# Patient Record
Sex: Female | Born: 1960 | Race: White | Hispanic: No | Smoking: Never smoker
Health system: Southern US, Community
[De-identification: ages and names within clinical notes are randomized; demographics above are authoritative.]

## PROBLEM LIST (undated history)

## (undated) DIAGNOSIS — E119 Type 2 diabetes mellitus without complications: Secondary | ICD-10-CM

## (undated) DIAGNOSIS — I10 Essential (primary) hypertension: Secondary | ICD-10-CM

## (undated) HISTORY — PX: LAPAROSCOPIC GASTRIC SLEEVE RESECTION: SHX5895

---

## 2017-02-15 ENCOUNTER — Emergency Department (HOSPITAL_COMMUNITY): Payer: PRIVATE HEALTH INSURANCE

## 2017-02-15 ENCOUNTER — Inpatient Hospital Stay (HOSPITAL_COMMUNITY)
Admission: EM | Admit: 2017-02-15 | Discharge: 2017-02-21 | DRG: 638 | Disposition: A | Discharge status: Home / Self Care | Payer: PRIVATE HEALTH INSURANCE | Attending: Family Medicine | Admitting: Family Medicine

## 2017-02-15 ENCOUNTER — Encounter (HOSPITAL_COMMUNITY): Payer: Self-pay | Admitting: Emergency Medicine

## 2017-02-15 DIAGNOSIS — Z888 Allergy status to other drugs, medicaments and biological substances status: Secondary | ICD-10-CM

## 2017-02-15 DIAGNOSIS — Z79899 Other long term (current) drug therapy: Secondary | ICD-10-CM | POA: Diagnosis not present

## 2017-02-15 DIAGNOSIS — R112 Nausea with vomiting, unspecified: Secondary | ICD-10-CM

## 2017-02-15 DIAGNOSIS — I10 Essential (primary) hypertension: Secondary | ICD-10-CM | POA: Diagnosis present

## 2017-02-15 DIAGNOSIS — E1110 Type 2 diabetes mellitus with ketoacidosis without coma: Principal | ICD-10-CM | POA: Diagnosis present

## 2017-02-15 DIAGNOSIS — Z7982 Long term (current) use of aspirin: Secondary | ICD-10-CM

## 2017-02-15 DIAGNOSIS — R609 Edema, unspecified: Secondary | ICD-10-CM | POA: Diagnosis not present

## 2017-02-15 DIAGNOSIS — Z9884 Bariatric surgery status: Secondary | ICD-10-CM | POA: Diagnosis not present

## 2017-02-15 DIAGNOSIS — Z794 Long term (current) use of insulin: Secondary | ICD-10-CM

## 2017-02-15 DIAGNOSIS — Y848 Other medical procedures as the cause of abnormal reaction of the patient, or of later complication, without mention of misadventure at the time of the procedure: Secondary | ICD-10-CM | POA: Diagnosis present

## 2017-02-15 DIAGNOSIS — D72829 Elevated white blood cell count, unspecified: Secondary | ICD-10-CM | POA: Diagnosis present

## 2017-02-15 DIAGNOSIS — E876 Hypokalemia: Secondary | ICD-10-CM | POA: Diagnosis not present

## 2017-02-15 DIAGNOSIS — E860 Dehydration: Secondary | ICD-10-CM | POA: Diagnosis present

## 2017-02-15 DIAGNOSIS — K9589 Other complications of other bariatric procedure: Secondary | ICD-10-CM | POA: Diagnosis present

## 2017-02-15 HISTORY — DX: Type 2 diabetes mellitus without complications: E11.9

## 2017-02-15 HISTORY — DX: Essential (primary) hypertension: I10

## 2017-02-15 LAB — COMPREHENSIVE METABOLIC PANEL
ALK PHOS: 129 U/L — AB (ref 38–126)
ALT: 62 U/L — AB (ref 14–54)
AST: 47 U/L — ABNORMAL HIGH (ref 15–41)
Albumin: 4.6 g/dL (ref 3.5–5.0)
Anion gap: 19 — ABNORMAL HIGH (ref 5–15)
BUN: 28 mg/dL — ABNORMAL HIGH (ref 6–20)
CALCIUM: 10.3 mg/dL (ref 8.9–10.3)
CO2: 10 mmol/L — ABNORMAL LOW (ref 22–32)
CREATININE: 1.06 mg/dL — AB (ref 0.44–1.00)
Chloride: 106 mmol/L (ref 101–111)
GFR, EST NON AFRICAN AMERICAN: 58 mL/min — AB (ref >60)
Glucose, Bld: 411 mg/dL — ABNORMAL HIGH (ref 65–99)
Potassium: 4.4 mmol/L (ref 3.5–5.1)
Sodium: 135 mmol/L (ref 135–145)
TOTAL PROTEIN: 8.9 g/dL — AB (ref 6.5–8.1)
Total Bilirubin: 2.1 mg/dL — ABNORMAL HIGH (ref 0.3–1.2)

## 2017-02-15 LAB — CBC WITH DIFFERENTIAL/PLATELET
Basophils Absolute: 0 10*3/uL (ref 0.0–0.1)
Basophils Relative: 0 %
EOS PCT: 0 %
Eosinophils Absolute: 0 10*3/uL (ref 0.0–0.7)
HCT: 50.1 % — ABNORMAL HIGH (ref 36.0–46.0)
HEMOGLOBIN: 17.6 g/dL — AB (ref 12.0–15.0)
LYMPHS ABS: 1.4 10*3/uL (ref 0.7–4.0)
LYMPHS PCT: 11 %
MCH: 30 pg (ref 26.0–34.0)
MCHC: 35.1 g/dL (ref 30.0–36.0)
MCV: 85.3 fL (ref 78.0–100.0)
Monocytes Absolute: 0.9 10*3/uL (ref 0.1–1.0)
Monocytes Relative: 7 %
Neutro Abs: 10.1 10*3/uL — ABNORMAL HIGH (ref 1.7–7.7)
Neutrophils Relative %: 82 %
PLATELETS: 386 10*3/uL (ref 150–400)
RBC: 5.87 MIL/uL — AB (ref 3.87–5.11)
RDW: 13.1 % (ref 11.5–15.5)
WBC: 12.4 10*3/uL — AB (ref 4.0–10.5)

## 2017-02-15 LAB — I-STAT CG4 LACTIC ACID, ED: LACTIC ACID, VENOUS: 2.05 mmol/L — AB (ref 0.5–1.9)

## 2017-02-15 LAB — CBG MONITORING, ED
GLUCOSE-CAPILLARY: 229 mg/dL — AB (ref 65–99)
Glucose-Capillary: 390 mg/dL — ABNORMAL HIGH (ref 65–99)

## 2017-02-15 LAB — MAGNESIUM: Magnesium: 2.4 mg/dL (ref 1.7–2.4)

## 2017-02-15 LAB — LIPASE, BLOOD: Lipase: 36 U/L (ref 11–51)

## 2017-02-15 MED ORDER — SODIUM CHLORIDE 0.9 % IV SOLN
INTRAVENOUS | Status: DC
  Administered 2017-02-15: 3.3 [IU]/h via INTRAVENOUS
  Administered 2017-02-16: 0.5 [IU]/h via INTRAVENOUS
  Administered 2017-02-16: 2.2 [IU]/h via INTRAVENOUS
  Filled 2017-02-15 (×2): qty 1

## 2017-02-15 MED ORDER — SODIUM CHLORIDE 0.9 % IV BOLUS (SEPSIS)
1000.0000 mL | Freq: Once | INTRAVENOUS | Status: AC
  Administered 2017-02-15: 1000 mL via INTRAVENOUS

## 2017-02-15 MED ORDER — MORPHINE SULFATE (PF) 4 MG/ML IV SOLN
4.0000 mg | Freq: Once | INTRAVENOUS | Status: AC
  Administered 2017-02-15: 4 mg via INTRAVENOUS
  Filled 2017-02-15: qty 1

## 2017-02-15 MED ORDER — IOPAMIDOL (ISOVUE-300) INJECTION 61%
100.0000 mL | Freq: Once | INTRAVENOUS | Status: AC | PRN
  Administered 2017-02-15: 100 mL via INTRAVENOUS

## 2017-02-15 MED ORDER — POTASSIUM CHLORIDE 10 MEQ/100ML IV SOLN
10.0000 meq | INTRAVENOUS | Status: AC
  Administered 2017-02-15 – 2017-02-16 (×2): 10 meq via INTRAVENOUS
  Filled 2017-02-15: qty 100

## 2017-02-15 MED ORDER — IOPAMIDOL (ISOVUE-300) INJECTION 61%
INTRAVENOUS | Status: AC
  Filled 2017-02-15: qty 100

## 2017-02-15 MED ORDER — SODIUM CHLORIDE 0.9 % IV SOLN
INTRAVENOUS | Status: DC
  Administered 2017-02-15 – 2017-02-16 (×2): via INTRAVENOUS

## 2017-02-15 MED ORDER — DEXTROSE-NACL 5-0.45 % IV SOLN
INTRAVENOUS | Status: DC
  Administered 2017-02-16: via INTRAVENOUS

## 2017-02-15 MED ORDER — POTASSIUM CHLORIDE 10 MEQ/100ML IV SOLN
10.0000 meq | INTRAVENOUS | Status: AC
  Administered 2017-02-15 (×2): 10 meq via INTRAVENOUS
  Filled 2017-02-15: qty 100

## 2017-02-15 MED ORDER — ONDANSETRON HCL 4 MG/2ML IJ SOLN
4.0000 mg | Freq: Once | INTRAMUSCULAR | Status: AC
  Administered 2017-02-15: 4 mg via INTRAVENOUS
  Filled 2017-02-15: qty 2

## 2017-02-15 MED ORDER — ENOXAPARIN SODIUM 40 MG/0.4ML ~~LOC~~ SOLN: 40.0000 mg | SUBCUTANEOUS | Status: DC

## 2017-02-15 NOTE — ED Provider Notes (Signed)
[facility]-EMERGENCY DEPT Provider Note   CSN: [id number] Arrival date & time: 02/15/17  1916     History   Chief Complaint Chief Complaint  Patient presents with  . Post-op Problem    HPI Nicole Kirk is a 56 y.o. female.  HPI   56 yo F with PMhx HTN, DM here with n/v and abd pain.  The patient states that Nicole Kirk recently underwent gastric bypass in [place] on 12/17.  Nicole Kirk recovered well and was discharged last Thursday.  Since then, Nicole Kirk has had initially improving abdominal pain.  Over the last several days, however, Nicole Kirk has had recurrent abdominal pain, nausea, vomiting, and hyperglycemia.  Nicole Kirk has had associated nausea and vomiting.  Nicole Kirk has had difficulty eating or drinking.  Denies any urinary symptoms but does note that her urine seems concentrated.  No fevers.  No blood in her emesis as well as no blood in her stools.  No melena.  Her pain is aching and cramp-like, diffuse, worse with eating and palpation.  Past Medical History:  Diagnosis Date  . Diabetes mellitus without complication (HCC)   . Hypertension     Patient Active Problem List   Diagnosis Date Noted  . DKA, type 2 (HCC) 02/15/2017  . 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 LAPAROSCOPIC GASTRIC SLEEVE RESECTION      OB History    No data available       Home Medications    Prior to Admission medications   Medication Sig Start Date End Date Taking? Authorizing Provider  aspirin EC 81 MG tablet Take 81 mg by mouth daily.   Yes [provider]  atorvastatin (LIPITOR) 80 MG tablet Take 80 mg by mouth daily. 01/04/17  Yes [provider]  gabapentin (NEURONTIN) 100 MG capsule Take 100 mg by mouth at bedtime as needed (foot pain).   Yes [provider]  insulin NPH Human (HUMULIN N,NOVOLIN N) 100 UNIT/ML injection Inject 15-17 Units into the skin 2 (two) times daily before a  meal. 17 units in the Morning, 15 units in the evening   Yes [provider]  insulin regular (NOVOLIN R,HUMULIN R) 100 units/mL injection Inject 25 Units into the skin 3 (three) times daily before meals.   Yes [provider]  lisinopril (PRINIVIL,ZESTRIL) 10 MG tablet Take 10 mg by mouth daily. 01/04/17  Yes [provider]    Family History Family History  Problem Relation Age of Onset  . Obesity Daughter        Sleeve Gastrectomy, also done in [place].    Social History Social History   Tobacco Use  . Smoking status: Never Smoker  . Smokeless tobacco: Never Used  Substance Use Topics  . Alcohol use: No    Frequency: Never  . Drug use: No     Allergies   Rocephin [ceftriaxone sodium in dextrose]   Review of Systems Review of Systems  Constitutional: Positive for fatigue.  Gastrointestinal: Positive for abdominal pain, nausea and vomiting.  Neurological: Positive for weakness.  All other systems reviewed and are negative.    Physical Exam Updated Vital Signs BP (!) 165/69   Pulse 91   Temp 99.5 F (37.5 C) (Rectal)   Resp 17   Ht 5\' 3"  (1.6 m)   Wt 88.9 kg (196 lb)   SpO2 100%   BMI 34.72 kg/m   Physical Exam  Constitutional: Nicole Kirk  is oriented to person, place, and time. Nicole Kirk appears well-developed and well-nourished. No distress.  HENT:  Head: Normocephalic and atraumatic.  Dry mucous membranes  Eyes: Conjunctivae are normal.  Neck: Neck supple.  Cardiovascular: Normal rate, regular rhythm and normal heart sounds. Exam reveals no friction rub.  No murmur heard. Pulmonary/Chest: Effort normal and breath sounds normal. No respiratory distress. Nicole Kirk has no wheezes. Nicole Kirk has no rales.  Tachypneic with clear lung sounds  Abdominal: Soft. Nicole Kirk exhibits no distension. There is tenderness (Mild, generalized).  Diffusely tender.  Operative sites appear clean, dry, and intact.  Musculoskeletal: Nicole Kirk exhibits no edema.  Neurological: Nicole Kirk is  alert and oriented to person, place, and time. Nicole Kirk exhibits normal muscle tone.  Skin: Skin is warm. Capillary refill takes less than 2 seconds.  Psychiatric: Nicole Kirk has a normal mood and affect.  Nursing note and vitals reviewed.    ED Treatments / Results  Labs (all labs ordered are listed, but only abnormal results are displayed) Labs Reviewed  CBC WITH DIFFERENTIAL/PLATELET - Abnormal; Notable for the following components:      Result Value   WBC 12.4 (*)    RBC 5.87 (*)    Hemoglobin 17.6 (*)    HCT 50.1 (*)    Neutro Abs 10.1 (*)    All other components within normal limits  COMPREHENSIVE METABOLIC PANEL - Abnormal; Notable for the following components:   CO2 10 (*)    Glucose, Bld 411 (*)    BUN 28 (*)    Creatinine, Ser 1.06 (*)    Total Protein 8.9 (*)    AST 47 (*)    ALT 62 (*)    Alkaline Phosphatase 129 (*)    Total Bilirubin 2.1 (*)    GFR calc non Af Amer 58 (*)    Anion gap 19 (*)    All other components within normal limits  I-STAT CG4 LACTIC ACID, ED - Abnormal; Notable for the following components:   Lactic Acid, Venous 2.05 (*)    All other components within normal limits  CBG MONITORING, ED - Abnormal; Notable for the following components:   Glucose-Capillary 390 (*)    All other components within normal limits  CBG MONITORING, ED - Abnormal; Notable for the following components:   Glucose-Capillary 229 (*)    All other components within normal limits  MAGNESIUM  LIPASE, BLOOD  HIV ANTIBODY (ROUTINE TESTING)  BASIC METABOLIC PANEL  BASIC METABOLIC PANEL  BASIC METABOLIC PANEL  BASIC METABOLIC PANEL  I-STAT CG4 LACTIC ACID, ED    EKG  EKG Interpretation None       Radiology Ct Abdomen Pelvis W Contrast  Result Date: 02/15/2017 CLINICAL DATA:  Nausea and vomiting EXAM: CT ABDOMEN AND PELVIS WITH CONTRAST TECHNIQUE: Multidetector CT imaging of the abdomen and pelvis was performed using the standard protocol following bolus administration of  intravenous contrast. CONTRAST:  100mL ISOVUE-300 IOPAMIDOL (ISOVUE-300) INJECTION 61% COMPARISON:  None. FINDINGS: Lower chest: No pulmonary nodules or pleural effusion. No visible pericardial effusion. Hepatobiliary: Normal hepatic contours and density. No visible biliary dilatation. Normal gallbladder. Pancreas: Normal contours without ductal dilatation. No peripancreatic fluid collection. Spleen: Normal. Adrenals/Urinary Tract: --Adrenal glands: Normal. --Right kidney/ureter: No hydronephrosis or perinephric stranding. No nephrolithiasis. No obstructing ureteral stones. --Left kidney/ureter: No hydronephrosis or perinephric stranding. No nephrolithiasis. No obstructing ureteral stones. --Urinary bladder: Unremarkable. Stomach/Bowel: --Stomach/Duodenum: Status post sleeve gastrectomy. Small hiatal hernia. Normal duodenum. --Small bowel: No dilatation or inflammation. --Colon: No focal abnormality. --Appendix: Normal.  Vascular/Lymphatic: Normal course and caliber of the major abdominal vessels. No abdominal or pelvic lymphadenopathy. Reproductive: Normal uterus and ovaries. Musculoskeletal. No bony spinal canal stenosis or focal osseous abnormality. Other: None. IMPRESSION: 1. No acute abdominopelvic abnormality. 2. Status post sleeve gastrectomy with small hiatal hernia. 3.  Aortic Atherosclerosis (ICD10-I70.0). Electronically Signed   By: Deatra Robinson M.D.   On: 02/15/2017 22:28    Procedures .Critical Care Performed by: Shaune Pollack, MD Authorized by: Shaune Pollack, MD   Critical care provider statement:    Critical care time (minutes):  35   Critical care time was exclusive of:  Separately billable procedures and treating other patients and teaching time   Critical care was necessary to treat or prevent imminent or life-threatening deterioration of the following conditions:  Circulatory failure, cardiac failure and metabolic crisis   Critical care was time spent personally by me on the  following activities:  Development of treatment plan with patient or surrogate, discussions with consultants, evaluation of patient's response to treatment, examination of patient, obtaining history from patient or surrogate, ordering and performing treatments and interventions, ordering and review of laboratory studies, ordering and review of radiographic studies, pulse oximetry, re-evaluation of patient's condition and review of old charts   I assumed direction of critical care for this patient from another provider in my specialty: no     (including critical care time)  Medications Ordered in ED Medications  insulin regular (NOVOLIN R,HUMULIN R) 100 Units in sodium chloride 0.9 % 100 mL (1 Units/mL) infusion (3.3 Units/hr Intravenous New Bag/Given 02/15/17 2251)  iopamidol (ISOVUE-300) 61 % injection (not administered)  0.9 %  sodium chloride infusion ( Intravenous New Bag/Given 02/15/17 2347)  dextrose 5 %-0.45 % sodium chloride infusion (not administered)  potassium chloride 10 mEq in 100 mL IVPB (10 mEq Intravenous New Bag/Given 02/15/17 2348)  atorvastatin (LIPITOR) tablet 80 mg (not administered)  gabapentin (NEURONTIN) capsule 100 mg (not administered)  sodium chloride 0.9 % bolus 1,000 mL (0 mLs Intravenous Stopped 02/15/17 2213)  sodium chloride 0.9 % bolus 1,000 mL (0 mLs Intravenous Stopped 02/15/17 2214)  ondansetron (ZOFRAN) injection 4 mg (4 mg Intravenous Given 02/15/17 2054)  morphine 4 MG/ML injection 4 mg (4 mg Intravenous Given 02/15/17 2213)  ondansetron (ZOFRAN) injection 4 mg (4 mg Intravenous Given 02/15/17 2213)  potassium chloride 10 mEq in 100 mL IVPB (0 mEq Intravenous Stopped 02/15/17 2313)  iopamidol (ISOVUE-300) 61 % injection 100 mL (100 mLs Intravenous Contrast Given 02/15/17 2157)     Initial Impression / Assessment and Plan / ED Course  I have reviewed the triage vital signs and the nursing notes.  Pertinent labs & imaging results that were available  during my care of the patient were reviewed by me and considered in my medical decision making (see chart for details).     56 year old female here with generalized abdominal pain and shortness of breath in the setting of recent surgery.  Her operative sites appear clean, dry, and intact.  CT scan shows no postoperative complications.  Lab work is concerning for DKA with hyperglycemia, bicarb of 10, and elevated anion gap and this is consistent with her symptoms.  Unfortunately, given that Nicole Kirk had a sleeve gastrectomy versus a Roux-en-Y, I feel Nicole Kirk is very high risk for recurrent episodes of this.  I discussed this with the patient.  Currently, will place the patient on insulin drip, continue fluids, and plan for admission.  Final Clinical Impressions(s) / ED Diagnoses   Final  diagnoses:  Diabetic ketoacidosis without coma associated with type 2 diabetes mellitus [facility])    ED Discharge Orders    None       Shaune Pollack, MD 02/16/17 0006

## 2017-02-15 NOTE — H&P (Signed)
History and Physical    Nicole Kirk:[id number] DOB: [birth year] DOA: 02/15/2017  PCP: Hildred Priest, NP  Patient coming from: Home  I have personally briefly reviewed patient's old medical records in [facility]  Chief Complaint: N/V  HPI: Nicole Kirk is a 56 y.o. female with medical history significant of DM2, HTN, A1C 12.1 last month according to endocrine note.  Unfortunately patient went to [place] and had laparoscopic sleeve gastrectomy performed on Monday this week.  For past 2 days has had severe N/V.  Presents to ED with ongoing symptoms.   ED Course: Found to be in DKA.  CT abd/pelvis shows sleeve gastrectomy, otherwise unremarkable.   Review of Systems: As per HPI otherwise 10 point review of systems negative.   Past Medical History:  Diagnosis Date  . Diabetes mellitus without complication (HCC)   . Hypertension     Past Surgical History:  Procedure Laterality Date  . LAPAROSCOPIC GASTRIC SLEEVE RESECTION       reports that  has never smoked. she has never used smokeless tobacco. She reports that she does not drink alcohol or use drugs.  Allergies  Allergen Reactions  . Rocephin [Ceftriaxone Sodium In Dextrose] Rash    Family History  Problem Relation Age of Onset  . Obesity Daughter        Sleeve Gastrectomy, also done in [place].     Prior to Admission medications   Medication Sig Start Date End Date Taking? Authorizing Provider  aspirin EC 81 MG tablet Take 81 mg by mouth daily.   Yes [provider]  atorvastatin (LIPITOR) 80 MG tablet Take 80 mg by mouth daily. 01/04/17  Yes [provider]  gabapentin (NEURONTIN) 100 MG capsule Take 100 mg by mouth at bedtime as needed (foot pain).   Yes [provider]  insulin NPH Human (HUMULIN N,NOVOLIN N) 100 UNIT/ML injection Inject 15-17 Units into the skin 2 (two) times daily before a meal. 17 units in the Morning, 15 units in the evening   Yes [provider]  insulin regular (NOVOLIN R,HUMULIN R) 100 units/mL injection Inject 25 Units into the skin 3 (three) times daily before meals.   Yes [provider]  lisinopril (PRINIVIL,ZESTRIL) 10 MG tablet Take 10 mg by mouth daily. 01/04/17  Yes [provider]    Physical Exam: Vitals:   02/15/17 1926 02/15/17 2125  BP: (!) 155/81 (!) 165/69  Pulse: (!) 107 91  Resp: 20 17  Temp: 98.1 F (36.7 C) 99.5 F (37.5 C)  TempSrc: Oral Rectal  SpO2: 100% 100%  Weight: 88.9 kg (196 lb)   Height: 5\' 3"  (1.6 m)     Constitutional: NAD, calm, comfortable Eyes: PERRL, lids and conjunctivae normal ENMT: Mucous membranes are Dry. Posterior pharynx clear of any exudate or lesions.Normal dentition.  Neck: normal, supple, no masses, no thyromegaly Respiratory: clear to auscultation bilaterally, no wheezing, no crackles. Normal respiratory effort. No accessory muscle use.  Cardiovascular: Regular rate and rhythm, no murmurs / rubs / gallops. No extremity edema. 2+ pedal pulses. No carotid bruits.  Abdomen: op sites C/D/I Musculoskeletal: no clubbing / cyanosis. No joint deformity upper and lower extremities. Good ROM, no contractures. Normal muscle tone.  Skin: no rashes, lesions, ulcers. No induration Neurologic: CN 2-12 grossly intact. Sensation intact, DTR normal. Strength 5/5 in all 4.  Psychiatric: Normal judgment and insight. Alert and oriented x 3. Normal mood.    Labs on Admission: I have personally  reviewed following labs and imaging studies  CBC: Recent Labs  Lab 02/15/17 1955  WBC 12.4*  NEUTROABS 10.1*  HGB 17.6*  HCT 50.1*  MCV 85.3  PLT 386   Basic Metabolic Panel: Recent Labs  Lab 02/15/17 1955  NA 135  K 4.4  CL 106  CO2 10*  GLUCOSE 411*  BUN 28*  CREATININE 1.06*  CALCIUM 10.3  MG 2.4   GFR: Estimated Creatinine Clearance: 62.7 mL/min (A) (by C-G formula based on SCr of 1.06 mg/dL (H)). Liver Function Tests: Recent Labs  Lab 02/15/17 1955    AST 47*  ALT 62*  ALKPHOS 129*  BILITOT 2.1*  PROT 8.9*  ALBUMIN 4.6   Recent Labs  Lab 02/15/17 1955  LIPASE 36   No results for input(s): AMMONIA in the last 168 hours. Coagulation Profile: No results for input(s): INR, PROTIME in the last 168 hours. Cardiac Enzymes: No results for input(s): CKTOTAL, CKMB, CKMBINDEX, TROPONINI in the last 168 hours. BNP (last 3 results) No results for input(s): PROBNP in the last 8760 hours. HbA1C: No results for input(s): HGBA1C in the last 72 hours. CBG: Recent Labs  Lab 02/15/17 2110  GLUCAP 390*   Lipid Profile: No results for input(s): CHOL, HDL, LDLCALC, TRIG, CHOLHDL, LDLDIRECT in the last 72 hours. Thyroid Function Tests: No results for input(s): TSH, T4TOTAL, FREET4, T3FREE, THYROIDAB in the last 72 hours. Anemia Panel: No results for input(s): VITAMINB12, FOLATE, FERRITIN, TIBC, IRON, RETICCTPCT in the last 72 hours. Urine analysis: No results found for: COLORURINE, APPEARANCEUR, LABSPEC, PHURINE, GLUCOSEU, HGBUR, BILIRUBINUR, KETONESUR, PROTEINUR, UROBILINOGEN, NITRITE, LEUKOCYTESUR  Radiological Exams on Admission: Ct Abdomen Pelvis W Contrast  Result Date: 02/15/2017 CLINICAL DATA:  Nausea and vomiting EXAM: CT ABDOMEN AND PELVIS WITH CONTRAST TECHNIQUE: Multidetector CT imaging of the abdomen and pelvis was performed using the standard protocol following bolus administration of intravenous contrast. CONTRAST:  100mL ISOVUE-300 IOPAMIDOL (ISOVUE-300) INJECTION 61% COMPARISON:  None. FINDINGS: Lower chest: No pulmonary nodules or pleural effusion. No visible pericardial effusion. Hepatobiliary: Normal hepatic contours and density. No visible biliary dilatation. Normal gallbladder. Pancreas: Normal contours without ductal dilatation. No peripancreatic fluid collection. Spleen: Normal. Adrenals/Urinary Tract: --Adrenal glands: Normal. --Right kidney/ureter: No hydronephrosis or perinephric stranding. No nephrolithiasis. No  obstructing ureteral stones. --Left kidney/ureter: No hydronephrosis or perinephric stranding. No nephrolithiasis. No obstructing ureteral stones. --Urinary bladder: Unremarkable. Stomach/Bowel: --Stomach/Duodenum: Status post sleeve gastrectomy. Small hiatal hernia. Normal duodenum. --Small bowel: No dilatation or inflammation. --Colon: No focal abnormality. --Appendix: Normal. Vascular/Lymphatic: Normal course and caliber of the major abdominal vessels. No abdominal or pelvic lymphadenopathy. Reproductive: Normal uterus and ovaries. Musculoskeletal. No bony spinal canal stenosis or focal osseous abnormality. Other: None. IMPRESSION: 1. No acute abdominopelvic abnormality. 2. Status post sleeve gastrectomy with small hiatal hernia. 3.  Aortic Atherosclerosis (ICD10-I70.0). Electronically Signed   By: Deatra RobinsonKevin  Herman M.D.   On: 02/15/2017 22:28    EKG: Independently reviewed.  Assessment/Plan Principal Problem:   DKA, type 2 (HCC) Active Problems:   Status post laparoscopic sleeve gastrectomy    1. DKA - 1. DKA pathway 2. IVF per pathway 3. Q4H BMPs 4. Insulin gtt 2. S/p laparoscopic sleeve gastrectomy - done on Monday in [place] 1. No abnormal findings on CT scan despite vomiting thus far 2. Nutrition consult 3. Not so sure this was the correct surgical procedure in this patient with DM and A1C of 12.1 last month. 4. Needs follow up with bariatric clinic / bariatric surgeon.  DVT prophylaxis: SCDs Code  Status: Full Family Communication: Family at bedside Disposition Plan: Home after admit Consults called: Dietary consult Admission status: Admit to inpatient - inpatient status for DKA treatment   Hillary BowGARDNER, JARED M. DO [facility] Pager [phone]  If 7AM-7PM, please contact day team taking care of patient [email or web address] Password [email or web address]  02/15/2017, 11:54 PM

## 2017-02-15 NOTE — ED Notes (Signed)
I gave critical I Stat CG4 result to MD Isaacs 

## 2017-02-15 NOTE — ED Triage Notes (Signed)
Patient presents with daughter stating pt had gastric sleeve surgery on Monday in [place]. Beginning two days ago pt began vomiting and states she cannot keep anything down. Daughter reports pt CBG reading 476 this evening after 20 units of Novolog. Pt denies any abdominal pain. Pain with inspiration.

## 2017-02-16 ENCOUNTER — Other Ambulatory Visit: Payer: Self-pay

## 2017-02-16 ENCOUNTER — Encounter (HOSPITAL_COMMUNITY): Payer: Self-pay | Admitting: Internal Medicine

## 2017-02-16 LAB — CBG MONITORING, ED
GLUCOSE-CAPILLARY: 278 mg/dL — AB (ref 65–99)
Glucose-Capillary: 135 mg/dL — ABNORMAL HIGH (ref 65–99)
Glucose-Capillary: 162 mg/dL — ABNORMAL HIGH (ref 65–99)
Glucose-Capillary: 254 mg/dL — ABNORMAL HIGH (ref 65–99)
Glucose-Capillary: 276 mg/dL — ABNORMAL HIGH (ref 65–99)

## 2017-02-16 LAB — GLUCOSE, CAPILLARY
GLUCOSE-CAPILLARY: 150 mg/dL — AB (ref 65–99)
GLUCOSE-CAPILLARY: 214 mg/dL — AB (ref 65–99)
GLUCOSE-CAPILLARY: 127 mg/dL — AB (ref 65–99)
Glucose-Capillary: 221 mg/dL — ABNORMAL HIGH (ref 65–99)
Glucose-Capillary: 169 mg/dL — ABNORMAL HIGH (ref 65–99)
Glucose-Capillary: 145 mg/dL — ABNORMAL HIGH (ref 65–99)
Glucose-Capillary: 264 mg/dL — ABNORMAL HIGH (ref 65–99)
Glucose-Capillary: 145 mg/dL — ABNORMAL HIGH (ref 65–99)

## 2017-02-16 LAB — BASIC METABOLIC PANEL
ANION GAP: 9 (ref 5–15)
ANION GAP: 9 (ref 5–15)
Anion gap: 6 (ref 5–15)
Anion gap: 13 (ref 5–15)
BUN: 16 mg/dL (ref 6–20)
BUN: 14 mg/dL (ref 6–20)
BUN: 16 mg/dL (ref 6–20)
BUN: 23 mg/dL — AB (ref 6–20)
CALCIUM: 8.5 mg/dL — AB (ref 8.9–10.3)
CALCIUM: 8.5 mg/dL — AB (ref 8.9–10.3)
CHLORIDE: 110 mmol/L (ref 101–111)
CO2: 15 mmol/L — ABNORMAL LOW (ref 22–32)
CO2: 13 mmol/L — ABNORMAL LOW (ref 22–32)
CO2: 18 mmol/L — AB (ref 22–32)
CO2: 18 mmol/L — AB (ref 22–32)
CREATININE: 0.71 mg/dL (ref 0.44–1.00)
CREATININE: 0.83 mg/dL (ref 0.44–1.00)
Calcium: 8.9 mg/dL (ref 8.9–10.3)
Calcium: 8.6 mg/dL — ABNORMAL LOW (ref 8.9–10.3)
Chloride: 114 mmol/L — ABNORMAL HIGH (ref 101–111)
Chloride: 111 mmol/L (ref 101–111)
Chloride: 112 mmol/L — ABNORMAL HIGH (ref 101–111)
Creatinine, Ser: 0.62 mg/dL (ref 0.44–1.00)
Creatinine, Ser: 0.61 mg/dL (ref 0.44–1.00)
GFR calc Af Amer: >60 mL/min (ref >60)
GFR calc Af Amer: >60 mL/min (ref >60)
GFR calc Af Amer: >60 mL/min (ref >60)
GFR calc Af Amer: >60 mL/min (ref >60)
GFR calc non Af Amer: >60 mL/min (ref >60)
GFR calc non Af Amer: >60 mL/min (ref >60)
GLUCOSE: 191 mg/dL — AB (ref 65–99)
GLUCOSE: 299 mg/dL — AB (ref 65–99)
Glucose, Bld: 195 mg/dL — ABNORMAL HIGH (ref 65–99)
Glucose, Bld: 308 mg/dL — ABNORMAL HIGH (ref 65–99)
POTASSIUM: 3.1 mmol/L — AB (ref 3.5–5.1)
POTASSIUM: 3.5 mmol/L (ref 3.5–5.1)
POTASSIUM: 4.8 mmol/L (ref 3.5–5.1)
Potassium: 4.5 mmol/L (ref 3.5–5.1)
SODIUM: 135 mmol/L (ref 135–145)
SODIUM: 137 mmol/L (ref 135–145)
Sodium: 139 mmol/L (ref 135–145)
Sodium: 137 mmol/L (ref 135–145)

## 2017-02-16 LAB — MRSA PCR SCREENING: MRSA by PCR: NEGATIVE

## 2017-02-16 LAB — I-STAT CG4 LACTIC ACID, ED: Lactic Acid, Venous: 1.48 mmol/L (ref 0.5–1.9)

## 2017-02-16 MED ORDER — SODIUM CHLORIDE 0.9 % IV SOLN: INTRAVENOUS | Status: DC

## 2017-02-16 MED ORDER — POTASSIUM CHLORIDE CRYS ER 20 MEQ PO TBCR
30.0000 meq | EXTENDED_RELEASE_TABLET | ORAL | Status: AC
  Administered 2017-02-16 – 2017-02-17 (×2): 30 meq via ORAL
  Filled 2017-02-16 (×2): qty 1

## 2017-02-16 MED ORDER — GABAPENTIN 100 MG PO CAPS
100.0000 mg | ORAL_CAPSULE | Freq: Every evening | ORAL | Status: DC | PRN
Start: 2017-02-16 — End: 2017-02-21

## 2017-02-16 MED ORDER — SODIUM CHLORIDE 0.9 % IV SOLN
INTRAVENOUS | Status: DC
  Filled 2017-02-16: qty 1

## 2017-02-16 MED ORDER — SODIUM CHLORIDE 0.9 % IV BOLUS (SEPSIS)
1000.0000 mL | Freq: Once | INTRAVENOUS | Status: AC
  Administered 2017-02-16: 1000 mL via INTRAVENOUS

## 2017-02-16 MED ORDER — DEXTROSE-NACL 5-0.45 % IV SOLN
INTRAVENOUS | Status: DC
  Administered 2017-02-16 – 2017-02-17 (×2): via INTRAVENOUS
  Administered 2017-02-18: 1000 mL via INTRAVENOUS

## 2017-02-16 MED ORDER — ONDANSETRON HCL 4 MG/2ML IJ SOLN
4.0000 mg | Freq: Four times a day (QID) | INTRAMUSCULAR | Status: DC | PRN
  Administered 2017-02-17 – 2017-02-18 (×3): 4 mg via INTRAVENOUS
  Filled 2017-02-16 (×3): qty 2

## 2017-02-16 MED ORDER — ATORVASTATIN CALCIUM 40 MG PO TABS
80.0000 mg | ORAL_TABLET | Freq: Every day | ORAL | Status: DC
  Administered 2017-02-16 – 2017-02-20 (×4): 80 mg via ORAL
  Filled 2017-02-16 (×2): qty 2
  Filled 2017-02-16: qty 1
  Filled 2017-02-16 (×3): qty 2

## 2017-02-16 MED ORDER — SODIUM CHLORIDE 0.9 % IV SOLN: INTRAVENOUS | Status: AC

## 2017-02-16 MED ORDER — POTASSIUM CHLORIDE 10 MEQ/100ML IV SOLN
10.0000 meq | INTRAVENOUS | Status: AC
  Administered 2017-02-16 (×2): 10 meq via INTRAVENOUS
  Filled 2017-02-16: qty 100

## 2017-02-16 NOTE — ED Notes (Signed)
CBG trending up, last CBG 278. Stopped Dextrose 5% until further MD evaluation

## 2017-02-16 NOTE — ED Notes (Signed)
Glucometer not crossing over to epic. Per NT [place], CBG 162.

## 2017-02-16 NOTE — Progress Notes (Addendum)
Inpatient Diabetes Program Recommendations  AACE/ADA: New Consensus Statement on Inpatient Glycemic Control (2015)  Target Ranges:  Prepandial:   less than 140 mg/dL      Peak postprandial:   less than 180 mg/dL (1-2 hours)      Critically ill patients:  140 - 180 mg/dL   Results for [name], Nicole H (MRN [id number]) as of 02/16/2017 10:23  Ref. Range 02/15/2017 19:55  Sodium Latest Ref Range: 135 - 145 mmol/L 135  Potassium Latest Ref Range: 3.5 - 5.1 mmol/L 4.4  Chloride Latest Ref Range: 101 - 111 mmol/L 106  CO2 Latest Ref Range: 22 - 32 mmol/L 10 (L)  Glucose Latest Ref Range: 65 - 99 mg/dL 409411 (H)  BUN Latest Ref Range: 6 - 20 mg/dL 28 (H)  Creatinine Latest Ref Range: 0.44 - 1.00 mg/dL 8.111.06 (H)  Calcium Latest Ref Range: 8.9 - 10.3 mg/dL 91.410.3  Anion gap Latest Ref Range: 5 - 15  19 (H)    Admit: DKA/ Patient went to [place] and had laparoscopic sleeve gastrectomy performed on Monday this week.  For past 2 days has had severe N/V.  History: DM  Home DM Meds: NPH Insulin 17 units AM/ 15 units PM   Regular Insulin- 25 units TID with meals   Current Meds: IV Insulin drip (started at 10pm last night)      Note patient admitted with DKA.  Getting IV Insulin drip and IVF.  Current BMET pending.  Switched to NPH and Regular insulin at her last Endocrinology visit (see below):  Patient saw her Endocrinologist March RummageAutumn Jones, [place] [facility] 01[facility].  At that visit, Provider noted the following: "Diabetes: No longer on V-Go 40. Now taking Lantus at 32 units qHS and Humulin R at 25 units at Breakfast and Lunch and 10 to 20 units at Supper.   No longer taking Humulin R U-500.  No longer taking metformin 500mg  1 tab BID, caused GI side effects.  Quit taking Trulicity once a week due to GI upset.  Tried Invokana and MidlandJardiance, but could not tolerate this due to burning with urination.   Did not bring her glucometer toda.  Denies hypoglycemia.  A1C today was  12.1% today, down from > 14% previously.  Ask [facility] to substitute their Relion R and Relion N for the Novolin R and N.   Hold Lantus and then start on Relion N at 17 units qAM and 15 units qPM - try to take every 12 hours."      --Will follow patient during hospitalization--  Ambrose FinlandJeannine Johnston Reyah Streeter RN, MSN, CDE Diabetes Coordinator Inpatient Glycemic Control Team Team Pager: [phone] (8a-5p)'

## 2017-02-16 NOTE — Consult Note (Signed)
Reason for Consult:nausea vomiting Referring Physician: NELEH, MULDOON is an 56 y.o. female.  HPI: Asked to see patient at the request of Dr Starla Link for 2-day history of persistent nausea and vomiting.  Patient has surgical history of sleeve gastrectomy in [place] a days ago.  [place] the country last [place] and has been doing well until 2 days ago when she developed persistent nausea and vomiting.  She presented yesterday to the emergency room and was found to be in DKA from her diabetes.  She has been resuscitated but has persistent nausea and vomiting.  Patient denies any history of preoperative nausea vomiting or history of gastroparesis.  She has no follow-up planned in [place] and will be followed by her nurse practitioner here in [place] she states.  CT scan was obtained admission which was otherwise normal showing sleeve gastrectomy changes without complication.  Past Medical History:  Diagnosis Date  . Diabetes mellitus without complication ([place])   . Hypertension     Past Surgical History:  Procedure Laterality Date  . LAPAROSCOPIC GASTRIC SLEEVE RESECTION      Family History  Problem Relation Age of Onset  . Obesity Daughter        Sleeve Gastrectomy, also done in [place].    Social History:  reports that  has never smoked. she has never used smokeless tobacco. She reports that she does not drink alcohol or use drugs.  Allergies:  Allergies  Allergen Reactions  . Rocephin [Ceftriaxone Sodium In Dextrose] Rash    Medications: I have reviewed the patient's current medications.  Results for orders placed or performed during the hospital encounter of 02/15/17 (from the past 48 hour(s))  Magnesium     Status: None   Collection Time: 02/15/17  7:55 PM  Result Value Ref Range   Magnesium 2.4 1.7 - 2.4 mg/dL  CBC with Differential     Status: Abnormal   Collection Time: 02/15/17  7:55 PM  Result Value Ref Range   WBC 12.4 (H) 4.0 - 10.5 K/uL   RBC 5.87 (H) 3.87 - 5.11 MIL/uL   Hemoglobin 17.6 (H) 12.0 - 15.0 g/dL   HCT 50.1 (H) 36.0 - 46.0 %   MCV 85.3 78.0 - 100.0 fL   MCH 30.0 26.0 - 34.0 pg   MCHC 35.1 30.0 - 36.0 g/dL   RDW 13.1 11.5 - 15.5 %   Platelets 386 150 - 400 K/uL   Neutrophils Relative % 82 %   Neutro Abs 10.1 (H) 1.7 - 7.7 K/uL   Lymphocytes Relative 11 %   Lymphs Abs 1.4 0.7 - 4.0 K/uL   Monocytes Relative 7 %   Monocytes Absolute 0.9 0.1 - 1.0 K/uL   Eosinophils Relative 0 %   Eosinophils Absolute 0.0 0.0 - 0.7 K/uL   Basophils Relative 0 %   Basophils Absolute 0.0 0.0 - 0.1 K/uL  Comprehensive metabolic panel     Status: Abnormal   Collection Time: 02/15/17  7:55 PM  Result Value Ref Range   Sodium 135 135 - 145 mmol/L   Potassium 4.4 3.5 - 5.1 mmol/L   Chloride 106 101 - 111 mmol/L   CO2 10 (L) 22 - 32 mmol/L   Glucose, Bld 411 (H) 65 - 99 mg/dL   BUN 28 (H) 6 - 20 mg/dL   Creatinine, Ser 1.06 (H) 0.44 - 1.00 mg/dL   Calcium 10.3 8.9 - 10.3 mg/dL   Total Protein 8.9 (H) 6.5 - 8.1 g/dL  Albumin 4.6 3.5 - 5.0 g/dL   AST 47 (H) 15 - 41 U/L   ALT 62 (H) 14 - 54 U/L   Alkaline Phosphatase 129 (H) 38 - 126 U/L   Total Bilirubin 2.1 (H) 0.3 - 1.2 mg/dL   GFR calc non Af Amer 58 (L) >60 mL/min   GFR calc Af Amer >60 >60 mL/min    Comment: (NOTE) The eGFR has been calculated using the CKD EPI equation. This calculation has not been validated in all clinical situations. eGFR's persistently <60 mL/min signify possible Chronic Kidney Disease.    Anion gap 19 (H) 5 - 15  Lipase, blood     Status: None   Collection Time: 02/15/17  7:55 PM  Result Value Ref Range   Lipase 36 11 - 51 U/L  I-Stat CG4 Lactic Acid, ED     Status: Abnormal   Collection Time: 02/15/17  9:04 PM  Result Value Ref Range   Lactic Acid, Venous 2.05 (HH) 0.5 - 1.9 mmol/L   Comment NOTIFIED PHYSICIAN   POC CBG, ED     Status: Abnormal   Collection Time: 02/15/17  9:10 PM  Result Value Ref Range   Glucose-Capillary 390 (H) 65  - 99 mg/dL  CBG monitoring, ED     Status: Abnormal   Collection Time: 02/15/17 11:56 PM  Result Value Ref Range   Glucose-Capillary 229 (H) 65 - 99 mg/dL  Basic metabolic panel     Status: Abnormal   Collection Time: 02/16/17 12:25 AM  Result Value Ref Range   Sodium 135 135 - 145 mmol/L   Potassium 4.8 3.5 - 5.1 mmol/L    Comment: HEMOLYSIS AT THIS LEVEL MAY AFFECT RESULT   Chloride 114 (H) 101 - 111 mmol/L   CO2 15 (L) 22 - 32 mmol/L   Glucose, Bld 195 (H) 65 - 99 mg/dL   BUN 23 (H) 6 - 20 mg/dL   Creatinine, Ser 0.83 0.44 - 1.00 mg/dL   Calcium 8.5 (L) 8.9 - 10.3 mg/dL   GFR calc non Af Amer >60 >60 mL/min   GFR calc Af Amer >60 >60 mL/min    Comment: (NOTE) The eGFR has been calculated using the CKD EPI equation. This calculation has not been validated in all clinical situations. eGFR's persistently <60 mL/min signify possible Chronic Kidney Disease. CORRECTED ON 12/25 AT 0118: PREVIOUSLY REPORTED AS >60    Anion gap 6 5 - 15  I-Stat CG4 Lactic Acid, ED     Status: None   Collection Time: 02/16/17 12:32 AM  Result Value Ref Range   Lactic Acid, Venous 1.48 0.5 - 1.9 mmol/L  CBG monitoring, ED     Status: Abnormal   Collection Time: 02/16/17  1:16 AM  Result Value Ref Range   Glucose-Capillary 135 (H) 65 - 99 mg/dL  CBG monitoring, ED     Status: Abnormal   Collection Time: 02/16/17  2:24 AM  Result Value Ref Range   Glucose-Capillary 162 (H) 65 - 99 mg/dL   Comment 1 Notify RN   CBG monitoring, ED     Status: Abnormal   Collection Time: 02/16/17  6:37 AM  Result Value Ref Range   Glucose-Capillary 254 (H) 65 - 99 mg/dL   Comment 1 Notify RN   CBG monitoring, ED     Status: Abnormal   Collection Time: 02/16/17  7:26 AM  Result Value Ref Range   Glucose-Capillary 278 (H) 65 - 99 mg/dL  Basic metabolic panel  Status: Abnormal   Collection Time: 02/16/17 11:10 AM  Result Value Ref Range   Sodium 137 135 - 145 mmol/L   Potassium 4.5 3.5 - 5.1 mmol/L   Chloride  111 101 - 111 mmol/L   CO2 13 (L) 22 - 32 mmol/L   Glucose, Bld 308 (H) 65 - 99 mg/dL   BUN 16 6 - 20 mg/dL   Creatinine, Ser 0.71 0.44 - 1.00 mg/dL   Calcium 8.5 (L) 8.9 - 10.3 mg/dL   GFR calc non Af Amer >60 >60 mL/min   GFR calc Af Amer >60 >60 mL/min    Comment: (NOTE) The eGFR has been calculated using the CKD EPI equation. This calculation has not been validated in all clinical situations. eGFR's persistently <60 mL/min signify possible Chronic Kidney Disease.    Anion gap 13 5 - 15  CBG monitoring, ED     Status: Abnormal   Collection Time: 02/16/17 11:10 AM  Result Value Ref Range   Glucose-Capillary 276 (H) 65 - 99 mg/dL  MRSA PCR Screening     Status: None   Collection Time: 02/16/17  1:30 PM  Result Value Ref Range   MRSA by PCR NEGATIVE NEGATIVE    Comment:        The GeneXpert MRSA Assay (FDA approved for NASAL specimens only), is one component of a comprehensive MRSA colonization surveillance program. It is not intended to diagnose MRSA infection nor to guide or monitor treatment for MRSA infections.   Glucose, capillary     Status: Abnormal   Collection Time: 02/16/17  2:20 PM  Result Value Ref Range   Glucose-Capillary 221 (H) 65 - 99 mg/dL    Ct Abdomen Pelvis W Contrast  Result Date: 02/15/2017 CLINICAL DATA:  Nausea and vomiting EXAM: CT ABDOMEN AND PELVIS WITH CONTRAST TECHNIQUE: Multidetector CT imaging of the abdomen and pelvis was performed using the standard protocol following bolus administration of intravenous contrast. CONTRAST:  162m ISOVUE-300 IOPAMIDOL (ISOVUE-300) INJECTION 61% COMPARISON:  None. FINDINGS: Lower chest: No pulmonary nodules or pleural effusion. No visible pericardial effusion. Hepatobiliary: Normal hepatic contours and density. No visible biliary dilatation. Normal gallbladder. Pancreas: Normal contours without ductal dilatation. No peripancreatic fluid collection. Spleen: Normal. Adrenals/Urinary Tract: --Adrenal glands:  Normal. --Right kidney/ureter: No hydronephrosis or perinephric stranding. No nephrolithiasis. No obstructing ureteral stones. --Left kidney/ureter: No hydronephrosis or perinephric stranding. No nephrolithiasis. No obstructing ureteral stones. --Urinary bladder: Unremarkable. Stomach/Bowel: --Stomach/Duodenum: Status post sleeve gastrectomy. Small hiatal hernia. Normal duodenum. --Small bowel: No dilatation or inflammation. --Colon: No focal abnormality. --Appendix: Normal. Vascular/Lymphatic: Normal course and caliber of the major abdominal vessels. No abdominal or pelvic lymphadenopathy. Reproductive: Normal uterus and ovaries. Musculoskeletal. No bony spinal canal stenosis or focal osseous abnormality. Other: None. IMPRESSION: 1. No acute abdominopelvic abnormality. 2. Status post sleeve gastrectomy with small hiatal hernia. 3.  Aortic Atherosclerosis (ICD10-I70.0). Electronically Signed   By: KUlyses JarredM.D.   On: 02/15/2017 22:28    Review of Systems  Constitutional: Negative for chills and fever.  HENT: Negative for hearing loss.   Eyes: Negative for blurred vision.  Respiratory: Negative for cough.   Cardiovascular: Positive for chest pain.  Gastrointestinal: Positive for nausea and vomiting. Negative for abdominal pain.  Genitourinary: Negative for dysuria.  Skin: Negative for itching and rash.  Neurological: Negative for dizziness and headaches.  Psychiatric/Behavioral: Negative for depression and suicidal ideas.   Blood pressure 140/70, pulse 72, temperature 99.5 F (37.5 C), temperature source Rectal, resp. rate 16, height  _0  (1.6 m), weight 88.9 kg (196 lb), SpO2 100 %. Physical Exam  Constitutional: She is oriented to person, place, and time. She appears well-developed and well-nourished.  HENT:  Head: Normocephalic and atraumatic.  Eyes: EOM are normal. Pupils are equal, round, and reactive to light.  Neck: Normal range of motion. Neck supple.  Cardiovascular: Normal rate  and regular rhythm.  Respiratory: Effort normal and breath sounds normal.  GI:    Musculoskeletal: Normal range of motion.  Neurological: She is alert and oriented to person, place, and time.  Skin: Skin is warm.  Psychiatric: She has a normal mood and affect. Her behavior is normal.    Assessment/Plan: 8 days post sleeve gastrectomy for morbid obesity and comorbidity in [place]  Type 2 diabetes mellitus   #3 DKA  Recommend upper GI which will be obtained tomorrow to evaluate the size and function of her remnant stomach.  No other acute issues at this point time.  Would keep n.p.o. and use antibiotics for now.  No role for NG tube.  Manasvini Whatley A Milanie Rosenfield 02/16/2017, 3:58 PM

## 2017-02-16 NOTE — Progress Notes (Signed)
Patient ID: Nicole Kirk Waltner, female   DOB: Jan 06, 1961, 56 y.o.   MRN: [id number]  PROGRESS NOTE    Nicole Kirk Rothenberger  [id number] DOB: Jan 06, 1961 DOA: 02/15/2017 PCP: Hildred PriestPayne, Susan M, NP   Brief Narrative:  56 year old female with history of diabetes mellitus type 2, hypertension, recent laparoscopic sleeve gastrectomy last Monday in [place] presented with severe nausea and vomiting for 2 days.  Patient was found to be in DKA and started on intravenous fluids and insulin drip.  CT abdomen/pelvis showed sleeve gastrectomy, otherwise unremarkable.  Assessment & Plan:   Principal Problem:   DKA, type 2 (HCC) Active Problems:   Status post laparoscopic sleeve gastrectomy  DKA -Patient was started on insulin drip and normal saline.  Normal saline was switched to D5 half-normal saline after blood sugar was less than 250.  BMP every 4 hours was ordered.  For some reason, there is no BMP since early this morning.  Have ordered stat BMP.  D5 half-normal saline was turned off by the nurse this morning because of blood sugar being 278.  Restart normal saline for now. I have asked the nurse to get stat BMP.  If there is persistence of anion gap, will restart insulin drip. -Consult diabetes coordinator  Status post laparoscopic sleeve gastrectomy -Done on Monday last week in [place] -No abnormal findings on CT scan -Will discuss with general surgery  Leukocytosis -Probably reactive.  Repeat a.m. labs  Dehydration -Continue IV fluids.   DVT prophylaxis: SCDs Code Status:  Full Family Communication: None at bedside Disposition Plan:Home in 1-2 days  Consultants: None Procedures: None  Antimicrobials: None    Subjective: Patient seen and examined at bedside.  She is currently not nauseous.  No fever or vomiting currently.  Objective: Vitals:   02/16/17 0719 02/16/17 0811 02/16/17 1025 02/16/17 1030  BP: (!) 131/46 (!) 132/47 (!) 149/70 (!) 148/67  Pulse: 79 83 82   Resp: 14 16 16 15     Temp:      TempSrc:      SpO2: 98% 96% 98%   Weight:      Height:        Intake/Output Summary (Last 24 hours) at 02/16/2017 1110 Last data filed at 02/16/2017 [id number] Gross per 24 hour  Intake 3000 ml  Output -  Net 3000 ml   Filed Weights   02/15/17 1926  Weight: 88.9 kg (196 lb)    Examination:  General exam: Appears calm and comfortable  Respiratory system: Bilateral decreased breath sound at bases Cardiovascular system: S1 & S2 heard, rate controlled  gastrointestinal system: Abdomen is nondistended, soft and nontender. Normal bowel sounds heard. Extremities: No cyanosis, clubbing, edema    Data Reviewed: I have personally reviewed following labs and imaging studies  CBC: Recent Labs  Lab 02/15/17 1955  WBC 12.4*  NEUTROABS 10.1*  HGB 17.6*  HCT 50.1*  MCV 85.3  PLT 386   Basic Metabolic Panel: Recent Labs  Lab 02/15/17 1955 02/16/17 0025  NA 135 135  K 4.4 4.8  CL 106 114*  CO2 10* 15*  GLUCOSE 411* 195*  BUN 28* 23*  CREATININE 1.06* 0.83  CALCIUM 10.3 8.5*  MG 2.4  --    GFR: Estimated Creatinine Clearance: 80.1 mL/min (by C-G formula based on SCr of 0.83 mg/dL). Liver Function Tests: Recent Labs  Lab 02/15/17 1955  AST 47*  ALT 62*  ALKPHOS 129*  BILITOT 2.1*  PROT 8.9*  ALBUMIN 4.6   Recent Labs  Lab  02/15/17 1955  LIPASE 36   No results for input(s): AMMONIA in the last 168 hours. Coagulation Profile: No results for input(s): INR, PROTIME in the last 168 hours. Cardiac Enzymes: No results for input(s): CKTOTAL, CKMB, CKMBINDEX, TROPONINI in the last 168 hours. BNP (last 3 results) No results for input(s): PROBNP in the last 8760 hours. HbA1C: No results for input(s): HGBA1C in the last 72 hours. CBG: Recent Labs  Lab 02/15/17 2356 02/16/17 0116 02/16/17 0224 02/16/17 0637 02/16/17 0726  GLUCAP 229* 135* 162* 254* 278*   Lipid Profile: No results for input(s): CHOL, HDL, LDLCALC, TRIG, CHOLHDL, LDLDIRECT in the  last 72 hours. Thyroid Function Tests: No results for input(s): TSH, T4TOTAL, FREET4, T3FREE, THYROIDAB in the last 72 hours. Anemia Panel: No results for input(s): VITAMINB12, FOLATE, FERRITIN, TIBC, IRON, RETICCTPCT in the last 72 hours. Sepsis Labs: Recent Labs  Lab 02/15/17 2104 02/16/17 0032  LATICACIDVEN 2.05* 1.48    No results found for this or any previous visit (from the past 240 hour(s)).       Radiology Studies: Ct Abdomen Pelvis W Contrast  Result Date: 02/15/2017 CLINICAL DATA:  Nausea and vomiting EXAM: CT ABDOMEN AND PELVIS WITH CONTRAST TECHNIQUE: Multidetector CT imaging of the abdomen and pelvis was performed using the standard protocol following bolus administration of intravenous contrast. CONTRAST:  100mL ISOVUE-300 IOPAMIDOL (ISOVUE-300) INJECTION 61% COMPARISON:  None. FINDINGS: Lower chest: No pulmonary nodules or pleural effusion. No visible pericardial effusion. Hepatobiliary: Normal hepatic contours and density. No visible biliary dilatation. Normal gallbladder. Pancreas: Normal contours without ductal dilatation. No peripancreatic fluid collection. Spleen: Normal. Adrenals/Urinary Tract: --Adrenal glands: Normal. --Right kidney/ureter: No hydronephrosis or perinephric stranding. No nephrolithiasis. No obstructing ureteral stones. --Left kidney/ureter: No hydronephrosis or perinephric stranding. No nephrolithiasis. No obstructing ureteral stones. --Urinary bladder: Unremarkable. Stomach/Bowel: --Stomach/Duodenum: Status post sleeve gastrectomy. Small hiatal hernia. Normal duodenum. --Small bowel: No dilatation or inflammation. --Colon: No focal abnormality. --Appendix: Normal. Vascular/Lymphatic: Normal course and caliber of the major abdominal vessels. No abdominal or pelvic lymphadenopathy. Reproductive: Normal uterus and ovaries. Musculoskeletal. No bony spinal canal stenosis or focal osseous abnormality. Other: None. IMPRESSION: 1. No acute abdominopelvic  abnormality. 2. Status post sleeve gastrectomy with small hiatal hernia. 3.  Aortic Atherosclerosis (ICD10-I70.0). Electronically Signed   By: Deatra RobinsonKevin  Herman M.D.   On: 02/15/2017 22:28        Scheduled Meds: . atorvastatin  80 mg Oral q1800   Continuous Infusions: . sodium chloride 100 mL/hr at 02/16/17 1105  . dextrose 5 % and 0.45% NaCl Stopped (02/16/17 0737)  . insulin (NOVOLIN-R) infusion 0.5 Units/hr (02/16/17 0241)     LOS: 1 day        Glade LloydKshitiz Quanda Pavlicek, MD [facility] Pager [phone]  If 7PM-7AM, please contact night-coverage [email or web address] Password [email or web address] 02/16/2017, 11:10 AM

## 2017-02-16 NOTE — Progress Notes (Signed)
Patient ID: Rolland PorterSharon H Kirk, female   DOB: 05-21-1960, 56 y.o.   MRN: [id number] Repeat BMP this morning shows AG of 13 and bicarbonate of 13. Will restart insulin drip and iv fluids. Continue BMP Q4 hours. Transition to subcutaneous long acting insulin once anion gap closes.

## 2017-02-16 NOTE — ED Notes (Signed)
Bed: [id number] Expected date:  Expected time:  Means of arrival:  Comments: Held for room 3

## 2017-02-17 ENCOUNTER — Inpatient Hospital Stay (HOSPITAL_COMMUNITY): Payer: PRIVATE HEALTH INSURANCE

## 2017-02-17 DIAGNOSIS — R112 Nausea with vomiting, unspecified: Secondary | ICD-10-CM

## 2017-02-17 LAB — GLUCOSE, CAPILLARY
GLUCOSE-CAPILLARY: 107 mg/dL — AB (ref 65–99)
GLUCOSE-CAPILLARY: 135 mg/dL — AB (ref 65–99)
GLUCOSE-CAPILLARY: 139 mg/dL — AB (ref 65–99)
GLUCOSE-CAPILLARY: 142 mg/dL — AB (ref 65–99)
GLUCOSE-CAPILLARY: 164 mg/dL — AB (ref 65–99)
GLUCOSE-CAPILLARY: 166 mg/dL — AB (ref 65–99)
GLUCOSE-CAPILLARY: 304 mg/dL — AB (ref 65–99)
Glucose-Capillary: 183 mg/dL — ABNORMAL HIGH (ref 65–99)
Glucose-Capillary: 190 mg/dL — ABNORMAL HIGH (ref 65–99)
Glucose-Capillary: 132 mg/dL — ABNORMAL HIGH (ref 65–99)
Glucose-Capillary: 219 mg/dL — ABNORMAL HIGH (ref 65–99)
Glucose-Capillary: 190 mg/dL — ABNORMAL HIGH (ref 65–99)
Glucose-Capillary: 234 mg/dL — ABNORMAL HIGH (ref 65–99)
Glucose-Capillary: 247 mg/dL — ABNORMAL HIGH (ref 65–99)

## 2017-02-17 LAB — BASIC METABOLIC PANEL
ANION GAP: 7 (ref 5–15)
Anion gap: 6 (ref 5–15)
BUN: 11 mg/dL (ref 6–20)
BUN: 12 mg/dL (ref 6–20)
CALCIUM: 8.4 mg/dL — AB (ref 8.9–10.3)
CHLORIDE: 112 mmol/L — AB (ref 101–111)
CO2: 19 mmol/L — AB (ref 22–32)
CO2: 20 mmol/L — AB (ref 22–32)
CREATININE: 0.46 mg/dL (ref 0.44–1.00)
Calcium: 8.4 mg/dL — ABNORMAL LOW (ref 8.9–10.3)
Chloride: 112 mmol/L — ABNORMAL HIGH (ref 101–111)
Creatinine, Ser: 0.47 mg/dL (ref 0.44–1.00)
GFR calc Af Amer: >60 mL/min (ref >60)
GFR calc non Af Amer: >60 mL/min (ref >60)
GLUCOSE: 167 mg/dL — AB (ref 65–99)
GLUCOSE: 195 mg/dL — AB (ref 65–99)
POTASSIUM: 3 mmol/L — AB (ref 3.5–5.1)
Potassium: 3 mmol/L — ABNORMAL LOW (ref 3.5–5.1)
Sodium: 138 mmol/L (ref 135–145)
Sodium: 138 mmol/L (ref 135–145)

## 2017-02-17 LAB — COMPREHENSIVE METABOLIC PANEL
ALT: 69 U/L — ABNORMAL HIGH (ref 14–54)
AST: 60 U/L — AB (ref 15–41)
Albumin: 3 g/dL — ABNORMAL LOW (ref 3.5–5.0)
Alkaline Phosphatase: 86 U/L (ref 38–126)
Anion gap: 6 (ref 5–15)
BILIRUBIN TOTAL: 1.1 mg/dL (ref 0.3–1.2)
BUN: 11 mg/dL (ref 6–20)
CO2: 19 mmol/L — ABNORMAL LOW (ref 22–32)
CREATININE: 0.43 mg/dL — AB (ref 0.44–1.00)
Calcium: 8.3 mg/dL — ABNORMAL LOW (ref 8.9–10.3)
Chloride: 113 mmol/L — ABNORMAL HIGH (ref 101–111)
Glucose, Bld: 193 mg/dL — ABNORMAL HIGH (ref 65–99)
POTASSIUM: 3 mmol/L — AB (ref 3.5–5.1)
Sodium: 138 mmol/L (ref 135–145)
TOTAL PROTEIN: 5.7 g/dL — AB (ref 6.5–8.1)

## 2017-02-17 LAB — CBC WITH DIFFERENTIAL/PLATELET
BASOS ABS: 0 10*3/uL (ref 0.0–0.1)
Basophils Relative: 0 %
EOS PCT: 3 %
Eosinophils Absolute: 0.2 10*3/uL (ref 0.0–0.7)
HEMATOCRIT: 35.7 % — AB (ref 36.0–46.0)
Hemoglobin: 12.4 g/dL (ref 12.0–15.0)
LYMPHS ABS: 1.2 10*3/uL (ref 0.7–4.0)
LYMPHS PCT: 17 %
MCH: 29.3 pg (ref 26.0–34.0)
MCHC: 34.7 g/dL (ref 30.0–36.0)
MCV: 84.4 fL (ref 78.0–100.0)
MONO ABS: 0.8 10*3/uL (ref 0.1–1.0)
MONOS PCT: 12 %
NEUTROS ABS: 4.7 10*3/uL (ref 1.7–7.7)
Neutrophils Relative %: 68 %
PLATELETS: 273 10*3/uL (ref 150–400)
RBC: 4.23 MIL/uL (ref 3.87–5.11)
RDW: 13.6 % (ref 11.5–15.5)
WBC: 6.9 10*3/uL (ref 4.0–10.5)

## 2017-02-17 LAB — MAGNESIUM: MAGNESIUM: 1.7 mg/dL (ref 1.7–2.4)

## 2017-02-17 MED ORDER — UNJURY CHICKEN SOUP POWDER
2.0000 [oz_av] | Freq: Four times a day (QID) | ORAL | Status: DC
  Administered 2017-02-17 – 2017-02-20 (×5): 2 [oz_av] via ORAL
  Filled 2017-02-17 (×2): qty 27

## 2017-02-17 MED ORDER — INSULIN GLARGINE 100 UNIT/ML ~~LOC~~ SOLN
10.0000 [IU] | Freq: Two times a day (BID) | SUBCUTANEOUS | Status: DC
  Administered 2017-02-17 – 2017-02-18 (×2): 10 [IU] via SUBCUTANEOUS
  Filled 2017-02-17 (×4): qty 0.1

## 2017-02-17 MED ORDER — INSULIN ASPART 100 UNIT/ML ~~LOC~~ SOLN
0.0000 [IU] | Freq: Three times a day (TID) | SUBCUTANEOUS | Status: DC
  Administered 2017-02-17: 5 [IU] via SUBCUTANEOUS
  Administered 2017-02-17: 2 [IU] via SUBCUTANEOUS
  Administered 2017-02-18: 5 [IU] via SUBCUTANEOUS
  Administered 2017-02-18: 3 [IU] via SUBCUTANEOUS
  Administered 2017-02-18: 5 [IU] via SUBCUTANEOUS
  Administered 2017-02-19 – 2017-02-20 (×4): 3 [IU] via SUBCUTANEOUS
  Administered 2017-02-20: 2 [IU] via SUBCUTANEOUS
  Administered 2017-02-21 (×2): 3 [IU] via SUBCUTANEOUS

## 2017-02-17 MED ORDER — INSULIN GLARGINE 100 UNIT/ML ~~LOC~~ SOLN
10.0000 [IU] | SUBCUTANEOUS | Status: DC
  Administered 2017-02-17: 10 [IU] via SUBCUTANEOUS
  Filled 2017-02-17: qty 0.1

## 2017-02-17 MED ORDER — POTASSIUM CHLORIDE 10 MEQ/100ML IV SOLN
10.0000 meq | INTRAVENOUS | Status: AC
  Administered 2017-02-17 (×6): 10 meq via INTRAVENOUS
  Filled 2017-02-17 (×3): qty 100

## 2017-02-17 MED ORDER — SODIUM CHLORIDE 0.9 % IV BOLUS (SEPSIS)
500.0000 mL | Freq: Once | INTRAVENOUS | Status: AC
  Administered 2017-02-17: 500 mL via INTRAVENOUS

## 2017-02-17 MED ORDER — IOPAMIDOL (ISOVUE-300) INJECTION 61%
INTRAVENOUS | Status: AC
  Administered 2017-02-17: 45 mL via ORAL
  Filled 2017-02-17: qty 50

## 2017-02-17 MED ORDER — POTASSIUM CHLORIDE CRYS ER 20 MEQ PO TBCR
30.0000 meq | EXTENDED_RELEASE_TABLET | Freq: Once | ORAL | Status: DC
  Filled 2017-02-17: qty 1

## 2017-02-17 MED ORDER — INSULIN ASPART 100 UNIT/ML ~~LOC~~ SOLN
0.0000 [IU] | Freq: Every day | SUBCUTANEOUS | Status: DC
  Administered 2017-02-17 – 2017-02-20 (×2): 2 [IU] via SUBCUTANEOUS

## 2017-02-17 NOTE — Progress Notes (Signed)
Note patient admitted with DKA.   Patient saw her Endocrinologist March RummageAutumn Jones, [place] [facility] on 01/04/[facility].  Switched to NPH and Regular insulin at her last Endocrinology visit.  Spoke with patient about her current A1c of 12.1.  Explained what an A1c is and what it measures.  Reminded patient that her goal A1c is 7% or less per ADA standards to prevent both acute and long-term complications.  Explained to patient the extreme importance of good glucose control at home.  Encouraged patient to check her CBGs at least TID AC at home and to record all CBGs in a logbook for her Endocrinologist to review.  Also discussed with patient diagnosis of DKA (pathophysiology), treatment of DKA, lab results, and transition plan to SQ insulin regimen.  Patient stated to me she has CBG meter at home and checks at least TID.  CBGs always run >200 mg/dl at home.  Stated to me that she chose to have bariatric surgery so she could get her CBGs under better control.  Stated that her insulin regimen does not seem to be working for her.  I asked her how long she had been taking these current doses of NPH and Regular insulins at home and patient stated since November when her ENDO switched her to NPH and Regular.  Has follow-up appt with her ENDO in January.  Did not have any questions for me about her DM.  Has CBG meter and has access to insulins.     --Will follow patient during hospitalization--  Ambrose FinlandJeannine Johnston Ivanna Kocak RN, MSN, CDE Diabetes Coordinator Inpatient Glycemic Control Team Team Pager: [phone] (8a-5p)

## 2017-02-17 NOTE — Progress Notes (Signed)
[facility]:  [phone] General Surgery Progress Note   LOS: 2 days  POD -     Chief Complaint: Diabetes  Assessment and Plan: 1.  S/P sleeve gastrectomy ([place]) - 02/08/2017  She is doing better  For UGI today - if UGI okay, may resume diet.  2.  DKA  Glucose - 219 - 02/17/2017  3.  DVT prophylaxis - no chemoprophylaxis   Principal Problem:   DKA, type 2 (HCC) Active Problems:   Status post laparoscopic sleeve gastrectomy  Subjective:  Alert.  Feels much better.  Awaiting UGI.  Objective:   Vitals:   02/17/17 0338 02/17/17 0800  BP:    Pulse:    Resp:    Temp: 97.9 F (36.6 C) 98.6 F (37 C)  SpO2:       Intake/Output from previous day:  12/25 0701 - 12/26 0700 In: 2407.6 [P.O.:240; I.V.:2167.6] Out: -   Intake/Output this shift:  No intake/output data recorded.   Physical Exam:   General: Obese wF who is alert and oriented.    HEENT: Normal. Pupils equal. .   Lungs: Clear.   Abdomen: Soft.  Wounds okay.   Lab Results:    Recent Labs    02/15/17 1955 02/17/17 0333  WBC 12.4* 6.9  HGB 17.6* 12.4  HCT 50.1* 35.7*  PLT 386 273    BMET   Recent Labs    02/16/17 2349 02/17/17 0333  NA 138 138  138  K 3.0* 3.0*  3.0*  CL 112* 113*  112*  CO2 19* 19*  20*  GLUCOSE 167* 193*  195*  BUN 12 11  11   CREATININE 0.47 0.43*  0.46  CALCIUM 8.4* 8.3*  8.4*    PT/INR  No results for input(s): LABPROT, INR in the last 72 hours.  ABG  No results for input(s): PHART, HCO3 in the last 72 hours.  Invalid input(s): PCO2, PO2   Studies/Results:  Ct Abdomen Pelvis W Contrast  Result Date: 02/15/2017 CLINICAL DATA:  Nausea and vomiting EXAM: CT ABDOMEN AND PELVIS WITH CONTRAST TECHNIQUE: Multidetector CT imaging of the abdomen and pelvis was performed using the standard protocol following bolus administration of intravenous contrast. CONTRAST:  100mL ISOVUE-300 IOPAMIDOL (ISOVUE-300) INJECTION 61% COMPARISON:  None.  FINDINGS: Lower chest: No pulmonary nodules or pleural effusion. No visible pericardial effusion. Hepatobiliary: Normal hepatic contours and density. No visible biliary dilatation. Normal gallbladder. Pancreas: Normal contours without ductal dilatation. No peripancreatic fluid collection. Spleen: Normal. Adrenals/Urinary Tract: --Adrenal glands: Normal. --Right kidney/ureter: No hydronephrosis or perinephric stranding. No nephrolithiasis. No obstructing ureteral stones. --Left kidney/ureter: No hydronephrosis or perinephric stranding. No nephrolithiasis. No obstructing ureteral stones. --Urinary bladder: Unremarkable. Stomach/Bowel: --Stomach/Duodenum: Status post sleeve gastrectomy. Small hiatal hernia. Normal duodenum. --Small bowel: No dilatation or inflammation. --Colon: No focal abnormality. --Appendix: Normal. Vascular/Lymphatic: Normal course and caliber of the major abdominal vessels. No abdominal or pelvic lymphadenopathy. Reproductive: Normal uterus and ovaries. Musculoskeletal. No bony spinal canal stenosis or focal osseous abnormality. Other: None. IMPRESSION: 1. No acute abdominopelvic abnormality. 2. Status post sleeve gastrectomy with small hiatal hernia. 3.  Aortic Atherosclerosis (ICD10-I70.0). Electronically Signed   By: Deatra RobinsonKevin  Herman M.D.   On: 02/15/2017 22:28     Anti-infectives:   Anti-infectives (From admission, onward)   None      Ovidio Kinavid Kahlani Graber, MD, FACS Pager: [phone] [facility]: [phone] 02/17/2017

## 2017-02-17 NOTE — Progress Notes (Signed)
Initial Nutrition Assessment  DOCUMENTATION CODES:   Obesity unspecified  INTERVENTION:  - Will order Unjury Chicken Soup 2 oz QID, this will provide a total of 8 oz which will provide 100 kcal and 21g protein. - Continue Bariatric CLD and advance to Bariatric FLD as medically feasible.  - Continue to encourage PO intakes of meals and supplements.  NUTRITION DIAGNOSIS:   Inadequate protein intake related to other (see comment)(current diet order) as evidenced by other (comment)(CLD does not meet estimated protein need).  GOAL:   Patient will meet greater than or equal to 90% of their needs  MONITOR:   PO intake, Supplement acceptance, Diet advancement, Weight trends, Labs  REASON FOR ASSESSMENT:   Consult Assessment of nutrition requirement/status  ASSESSMENT:   56 year old female with history of diabetes mellitus type 2, hypertension, recent laparoscopic sleeve gastrectomy last Monday in [place] presented with severe nausea and vomiting for 2 days.  Patient was found to be in DKA and started on intravenous fluids and insulin drip.  CT abdomen/pelvis showed sleeve gastrectomy, otherwise unremarkable.   BMI indicates obesity. Pt had UGI earlier this afternoon and is now on CLD (RD changed order to be Bariatric CLD). Pt reports that she was experiencing N/V x2 days PTA and felt that it was d/t DM and not related to her abdominal surgery. She denies having any abdominal pain from the time of surgery until now. Prior to surgery, she was provided with online materials from the team in [place] which outlined diet/diet advancement following surgery, informed her not to use straws or consume carbonated items, to focus on protein with no less than 27 grams of protein/day, and to be walking longer distances (gives the example of 1 mile) in the month post-op.   Provided pt with handouts provided to [facility]ric surgery [facility]; these outline vitamin and mineral supplements,  protein supplements, and diet for the first 2 weeks post-op. She has purchased protein waters and protein shakes for use at home. She denies having any follow-up appointments in [place] and states "we aren't going back there." Pt denies any nutrition-related questions or concerns at this time. Advised her to let RN know should she have questions and RD is more than happy to answer these.   Medications reviewed; sliding scale Novolog, 10 units Lantus BID, 10 mEq IV KCl x6 runs today, 30 mEq oral KCl x1 dose today.  Labs reviewed; CBGs: 107-219 mg/dL, K: 3 mmol/L, Cl: 161113 mmol/L, Ca: 8.4 mg/dL, LFTs elevated, [id number]: 12.1% in November.  IVF: D5-1/2 NS @ 75 mL/hr (306 kcal)     NUTRITION - FOCUSED PHYSICAL EXAM:  Completed/assessed; no muscle and no fat wasting noted.  Diet Order:  Diet bariatric clear liquid Room service appropriate? Yes; Fluid consistency: Thin  EDUCATION NEEDS:   Education needs have been addressed  Skin:  Skin Assessment: Skin Integrity Issues: Skin Integrity Issues:: Incisions Incisions: lap sites from sleeve gastrectomy on 02/08/17  Last BM:  PTA/unknown  Height:   Ht Readings from Last 1 Encounters:  02/15/17 5\' 3"  (1.6 m)    Weight:   Wt Readings from Last 1 Encounters:  02/15/17 196 lb (88.9 kg)    Ideal Body Weight:  52.27 kg  BMI:  Body mass index is 34.72 kg/m.  Estimated Nutritional Needs:   Kcal:  1335-1600 (15-18 kcal/kg)  Protein:  70-80 grams  Fluid:  >/= 1.8 L/day      Trenton GammonJessica Anneka Studer, MS, RD, LDN, CNSC Inpatient Clinical Dietitian Pager #  [phone] After hours/weekend pager # [phone]

## 2017-02-17 NOTE — Progress Notes (Signed)
Patient ID: Nicole Kirk, female   DOB: [birth year], 56 y.o.   MRN: [id number]  PROGRESS NOTE    Nicole Kirk  WUJ:[id number] DOB: 1960-08-06 DOA: 02/15/2017 PCP: Hildred Priest, NP   Brief Narrative:  56 year old female with history of diabetes mellitus type 2, hypertension, recent laparoscopic sleeve gastrectomy last Monday in [place] presented with severe nausea and vomiting for 2 days.  Patient was found to be in DKA and started on intravenous fluids and insulin drip.  CT abdomen/pelvis showed sleeve gastrectomy, otherwise unremarkable. General surgery was consulted  Assessment & Plan:   Principal Problem:   DKA, type 2 (HCC) Active Problems:   Status post laparoscopic sleeve gastrectomy  DKA -Currently on insulin drip. Anion gap has closed. Switch to long-acting insulin subcutaneously then turned the insulin drip off. Currently nothing by mouth as per general surgery. Diet will be advanced once cleared by general surgery.  -Diabetes coordinator following  Status post laparoscopic sleeve gastrectomy -Done on Monday last week in [place] -No abnormal findings on CT scan -General surgery following and the plan to do upper GI series.  Hypokalemia -Replace. Repeat a.m. labs  Leukocytosis -Probably reactive. Resolved  Dehydration -Improving with IV fluids.   DVT prophylaxis: SCDs Code Status:  Full Family Communication: None at bedside Disposition Plan:Home in 1-2 days  Consultants: None Procedures: None  Antimicrobials: None    Subjective: Patient seen and examined at bedside.  She is currently not nauseous although she vomited this morning.  No fever or abdominal pain currently.   Objective: Vitals:   02/16/17 1814 02/16/17 1910 02/16/17 1950 02/17/17 0338  BP: 134/85  (!) 135/58   Pulse: 79  89   Resp: 16  16   Temp:  98.2 F (36.8 C)  97.9 F (36.6 C)  TempSrc:  Oral  Oral  SpO2: 100%  100%   Weight:      Height:        Intake/Output Summary (Last  24 hours) at 02/17/2017 0818 Last data filed at 02/17/2017 0800 Gross per 24 hour  Intake 1807.55 ml  Output -  Net 1807.55 ml   Filed Weights   02/15/17 1926  Weight: 88.9 kg (196 lb)    Examination:  General exam: Appears calm and comfortable. No distress Respiratory system: Bilateral decreased breath sound at bases Cardiovascular system: S1 & S2 heard, rate controlled  gastrointestinal system: Abdomen is nondistended, soft and nontender. Normal bowel sounds heard. Extremities: No cyanosis, clubbing, edema    Data Reviewed: I have personally reviewed following labs and imaging studies  CBC: Recent Labs  Lab 02/15/17 1955 02/17/17 0333  WBC 12.4* 6.9  NEUTROABS 10.1* 4.7  HGB 17.6* 12.4  HCT 50.1* 35.7*  MCV 85.3 84.4  PLT 386 273   Basic Metabolic Panel: Recent Labs  Lab 02/15/17 1955  02/16/17 1110 02/16/17 1542 02/16/17 1957 02/16/17 2349 02/17/17 0333  NA 135   < > 137 139 137 138 138  138  K 4.4   < > 4.5 3.5 3.1* 3.0* 3.0*  3.0*  CL 106   < > 111 112* 110 112* 113*  112*  CO2 10*   < > 13* 18* 18* 19* 19*  20*  GLUCOSE 411*   < > 308* 191* 299* 167* 193*  195*  BUN 28*   < > 16 16 14 12 11  11   CREATININE 1.06*   < > 0.71 0.62 0.61 0.47 0.43*  0.46  CALCIUM 10.3   < >  8.5* 8.9 8.6* 8.4* 8.3*  8.4*  MG 2.4  --   --   --   --   --  1.7   < > = values in this interval not displayed.   GFR: Estimated Creatinine Clearance: 83.1 mL/min (by C-G formula based on SCr of 0.46 mg/dL). Liver Function Tests: Recent Labs  Lab 02/15/17 1955 02/17/17 0333  AST 47* 60*  ALT 62* 69*  ALKPHOS 129* 86  BILITOT 2.1* 1.1  PROT 8.9* 5.7*  ALBUMIN 4.6 3.0*   Recent Labs  Lab 02/15/17 1955  LIPASE 36   No results for input(s): AMMONIA in the last 168 hours. Coagulation Profile: No results for input(s): INR, PROTIME in the last 168 hours. Cardiac Enzymes: No results for input(s): CKTOTAL, CKMB, CKMBINDEX, TROPONINI in the last 168 hours. BNP (last 3  results) No results for input(s): PROBNP in the last 8760 hours. HbA1C: No results for input(s): HGBA1C in the last 72 hours. CBG: Recent Labs  Lab 02/17/17 0330 02/17/17 0430 02/17/17 0541 02/17/17 0641 02/17/17 0747  GLUCAP 190* 142* 139* 107* 132*   Lipid Profile: No results for input(s): CHOL, HDL, LDLCALC, TRIG, CHOLHDL, LDLDIRECT in the last 72 hours. Thyroid Function Tests: No results for input(s): TSH, T4TOTAL, FREET4, T3FREE, THYROIDAB in the last 72 hours. Anemia Panel: No results for input(s): VITAMINB12, FOLATE, FERRITIN, TIBC, IRON, RETICCTPCT in the last 72 hours. Sepsis Labs: Recent Labs  Lab 02/15/17 2104 02/16/17 0032  LATICACIDVEN 2.05* 1.48    Recent Results (from the past 240 hour(s))  MRSA PCR Screening     Status: None   Collection Time: 02/16/17  1:30 PM  Result Value Ref Range Status   MRSA by PCR NEGATIVE NEGATIVE Final    Comment:        The GeneXpert MRSA Assay (FDA approved for NASAL specimens only), is one component of a comprehensive MRSA colonization surveillance program. It is not intended to diagnose MRSA infection nor to guide or monitor treatment for MRSA infections.          Radiology Studies: Ct Abdomen Pelvis W Contrast  Result Date: 02/15/2017 CLINICAL DATA:  Nausea and vomiting EXAM: CT ABDOMEN AND PELVIS WITH CONTRAST TECHNIQUE: Multidetector CT imaging of the abdomen and pelvis was performed using the standard protocol following bolus administration of intravenous contrast. CONTRAST:  100mL ISOVUE-300 IOPAMIDOL (ISOVUE-300) INJECTION 61% COMPARISON:  None. FINDINGS: Lower chest: No pulmonary nodules or pleural effusion. No visible pericardial effusion. Hepatobiliary: Normal hepatic contours and density. No visible biliary dilatation. Normal gallbladder. Pancreas: Normal contours without ductal dilatation. No peripancreatic fluid collection. Spleen: Normal. Adrenals/Urinary Tract: --Adrenal glands: Normal. --Right  kidney/ureter: No hydronephrosis or perinephric stranding. No nephrolithiasis. No obstructing ureteral stones. --Left kidney/ureter: No hydronephrosis or perinephric stranding. No nephrolithiasis. No obstructing ureteral stones. --Urinary bladder: Unremarkable. Stomach/Bowel: --Stomach/Duodenum: Status post sleeve gastrectomy. Small hiatal hernia. Normal duodenum. --Small bowel: No dilatation or inflammation. --Colon: No focal abnormality. --Appendix: Normal. Vascular/Lymphatic: Normal course and caliber of the major abdominal vessels. No abdominal or pelvic lymphadenopathy. Reproductive: Normal uterus and ovaries. Musculoskeletal. No bony spinal canal stenosis or focal osseous abnormality. Other: None. IMPRESSION: 1. No acute abdominopelvic abnormality. 2. Status post sleeve gastrectomy with small hiatal hernia. 3.  Aortic Atherosclerosis (ICD10-I70.0). Electronically Signed   By: Deatra RobinsonKevin  Herman M.D.   On: 02/15/2017 22:28        Scheduled Meds: . atorvastatin  80 mg Oral q1800  . potassium chloride  30 mEq Oral Once   Continuous Infusions: .  sodium chloride Stopped (02/16/17 1755)  . sodium chloride    . dextrose 5 % and 0.45% NaCl Stopped (02/16/17 0737)  . dextrose 5 % and 0.45% NaCl 75 mL/hr at 02/16/17 1800  . insulin (NOVOLIN-R) infusion 0.7 Units/hr (02/17/17 0757)  . potassium chloride       LOS: 2 days        Glade LloydKshitiz Ivet Guerrieri, MD [facility] Pager [phone]  If 7PM-7AM, please contact night-coverage [email or web address] Password [email or web address] 02/17/2017, 8:18 AM

## 2017-02-17 NOTE — Progress Notes (Addendum)
Inpatient Diabetes Program Recommendations  AACE/ADA: New Consensus Statement on Inpatient Glycemic Control (2015)  Target Ranges:  Prepandial:   less than 140 mg/dL      Peak postprandial:   less than 180 mg/dL (1-2 hours)      Critically ill patients:  140 - 180 mg/dL   Results for [name], Kamdyn H (MRN [id number]) as of 02/17/2017 07:40  Ref. Range 02/17/2017 03:33  Sodium Latest Ref Range: 135 - 145 mmol/L 138  Potassium Latest Ref Range: 3.5 - 5.1 mmol/L 3.0 (L)  Chloride Latest Ref Range: 101 - 111 mmol/L 113 (H)  CO2 Latest Ref Range: 22 - 32 mmol/L 19 (L)  Glucose Latest Ref Range: 65 - 99 mg/dL 409193 (H)  BUN Latest Ref Range: 6 - 20 mg/dL 11  Creatinine Latest Ref Range: 0.44 - 1.00 mg/dL 8.110.43 (L)  Calcium Latest Ref Range: 8.9 - 10.3 mg/dL 8.3 (L)  Anion gap Latest Ref Range: 5 - 15  6     Admit: DKA/ Patient went to [place] and had laparoscopic sleeve gastrectomy performed on Monday this week. For past 2 days has had severe N/V.  History: DM  Home DM Meds: NPH Insulin 17 units AM/ 15 units PM                         Regular Insulin- 25 units TID with meals   Current Meds: IV Insulin drip      Note patient admitted with DKA.   Patient saw her Endocrinologist March RummageAutumn Jones, [place] [facility] on 01/04/17.  Switched to NPH and Regular insulin at her last Endocrinology visit.    MD- Note 3am BMET today shows Anion gap improved to 6 and CO2 improved to 19.  At time of transition to SQ insulin, may want to transition patient to Lantus and Novolog while here in hospital and then can convert back to NPH and Regular insulins at time of d/c.  Make sure to start Lantus 1-2 hours prior to d/c of IV Insulin drip.      --Will follow patient during hospitalization--  Ambrose FinlandJeannine Johnston Emerald Gehres RN, MSN, CDE Diabetes Coordinator Inpatient Glycemic Control Team Team Pager: [phone] (8a-5p)

## 2017-02-17 NOTE — Care Management Note (Signed)
Case Management Note  Patient Details  Name: Rolland PorterSharon H Catterton MRN: [id number] Date of Birth: [birth year]  Subjective/Objective:                  S/p gastric sleeve and dka Action/Plan: Date: February 17, 2017 Marcelle SmilingRhonda Davis, BSN, Anton RuizRN3, [place]CCM [phone] Chart and notes review for patient progress and needs. Will follow for case management and discharge needs. Next review date: [id number]  Expected Discharge Date:                  Expected Discharge Plan:  Home/Self Care  In-House Referral:     Discharge planning Services  CM Consult  Post Acute Care Choice:    Choice offered to:     DME Arranged:    DME Agency:     HH Arranged:    HH Agency:     Status of Service:  In process, will continue to follow  If discussed at Long Length of Stay Meetings, dates discussed:    Additional Comments:  Golda AcreDavis, Rhonda Lynn, RN 02/17/2017, 8:38 AM

## 2017-02-18 LAB — CBC WITH DIFFERENTIAL/PLATELET
BASOS ABS: 0 10*3/uL (ref 0.0–0.1)
Basophils Relative: 0 %
EOS ABS: 0.2 10*3/uL (ref 0.0–0.7)
EOS PCT: 3 %
HCT: 37.1 % (ref 36.0–46.0)
Hemoglobin: 13.1 g/dL (ref 12.0–15.0)
LYMPHS ABS: 1.1 10*3/uL (ref 0.7–4.0)
Lymphocytes Relative: 18 %
MCH: 29.6 pg (ref 26.0–34.0)
MCHC: 35.3 g/dL (ref 30.0–36.0)
MCV: 83.9 fL (ref 78.0–100.0)
Monocytes Absolute: 0.9 10*3/uL (ref 0.1–1.0)
Monocytes Relative: 15 %
Neutro Abs: 4 10*3/uL (ref 1.7–7.7)
Neutrophils Relative %: 64 %
PLATELETS: 268 10*3/uL (ref 150–400)
RBC: 4.42 MIL/uL (ref 3.87–5.11)
RDW: 13.2 % (ref 11.5–15.5)
WBC: 6.2 10*3/uL (ref 4.0–10.5)

## 2017-02-18 LAB — BASIC METABOLIC PANEL
Anion gap: 7 (ref 5–15)
CALCIUM: 8.4 mg/dL — AB (ref 8.9–10.3)
CO2: 24 mmol/L (ref 22–32)
CREATININE: 0.41 mg/dL — AB (ref 0.44–1.00)
Chloride: 103 mmol/L (ref 101–111)
GFR calc Af Amer: >60 mL/min (ref >60)
Glucose, Bld: 254 mg/dL — ABNORMAL HIGH (ref 65–99)
Potassium: 3 mmol/L — ABNORMAL LOW (ref 3.5–5.1)
SODIUM: 134 mmol/L — AB (ref 135–145)

## 2017-02-18 LAB — HIV ANTIBODY (ROUTINE TESTING W REFLEX): HIV SCREEN 4TH GENERATION: NONREACTIVE

## 2017-02-18 LAB — GLUCOSE, CAPILLARY
Glucose-Capillary: 242 mg/dL — ABNORMAL HIGH (ref 65–99)
Glucose-Capillary: 230 mg/dL — ABNORMAL HIGH (ref 65–99)
Glucose-Capillary: 178 mg/dL — ABNORMAL HIGH (ref 65–99)
Glucose-Capillary: 165 mg/dL — ABNORMAL HIGH (ref 65–99)

## 2017-02-18 LAB — MAGNESIUM: MAGNESIUM: 1.6 mg/dL — AB (ref 1.7–2.4)

## 2017-02-18 MED ORDER — POTASSIUM CHLORIDE CRYS ER 20 MEQ PO TBCR: 60.0000 meq | EXTENDED_RELEASE_TABLET | Freq: Once | ORAL | Status: DC

## 2017-02-18 MED ORDER — POTASSIUM CHLORIDE 20 MEQ/15ML (10%) PO SOLN
30.0000 meq | ORAL | Status: AC
  Administered 2017-02-18 (×2): 30 meq via ORAL
  Filled 2017-02-18 (×2): qty 30

## 2017-02-18 MED ORDER — INSULIN GLARGINE 100 UNIT/ML ~~LOC~~ SOLN
13.0000 [IU] | Freq: Two times a day (BID) | SUBCUTANEOUS | Status: DC
  Administered 2017-02-18 – 2017-02-21 (×6): 13 [IU] via SUBCUTANEOUS
  Filled 2017-02-18 (×7): qty 0.13

## 2017-02-18 MED ORDER — MAGNESIUM SULFATE 2 GM/50ML IV SOLN
2.0000 g | Freq: Once | INTRAVENOUS | Status: AC
  Administered 2017-02-18: 2 g via INTRAVENOUS
  Filled 2017-02-18: qty 50

## 2017-02-18 MED ORDER — ONDANSETRON HCL 4 MG/2ML IJ SOLN
4.0000 mg | Freq: Four times a day (QID) | INTRAMUSCULAR | Status: DC
Start: 2017-02-18 — End: 2017-02-21
  Administered 2017-02-18 – 2017-02-20 (×6): 4 mg via INTRAVENOUS
  Filled 2017-02-18 (×10): qty 2

## 2017-02-18 NOTE — Progress Notes (Addendum)
[facility] Progress Note      Assessment/Plan DKA - per medicine  Nausea and Vomiting - S/P sleeve gastrectomy ([place]) - 02/08/2017 - UGI 12/26 showed persistent area of luminal narrowing in the mid body of the stomach concerning for an area of residual postoperative edema - could this be improved with steroids although concerning in the setting of DKA????  FEN: clears VTE: SCD's, okay for chemical DVT prophylaxis from a surgical standpoint ID: none Foley: none Follow up: TBD  DISPO: pt still having persistent N&V, may need steroids and TPN if this does not resolve.   LOS: 3 days    Subjective:  CC: nausea and vomiting  Pt states after taking anything orally, roughly 30min later she has vomiting. She has not been able to keep even ice chips down. No abdominal pain. Having flatus and BM's. No fever or chills.   Objective: Vital signs in last 24 hours: Temp:  [98.3 F (36.8 C)-99.4 F (37.4 C)] 99.3 F (37.4 C) (12/27 0558) Pulse Rate:  [79-84] 80 (12/27 0558) Resp:  [16-19] 16 (12/27 0558) BP: (138-157)/(56-83) 138/83 (12/27 0558) SpO2:  [98 %-99 %] 98 % (12/27 0558) Last BM Date: 02/18/17  Intake/Output from previous day: 12/26 0701 - 12/27 0700 In: 2312.5 [P.O.:60; I.V.:1852.5; IV Piggyback:400] Out: 403 [Urine:401; Emesis/NG output:1; Stool:1] Intake/Output this shift: Total I/O In: -  Out: 350 [Urine:350]  PE: Gen:  Alert, NAD, pleasant, cooperative Card:  RRR, no M/G/R heard Pulm:  Rate and effort normal Abd: Soft, obese, not distended, +BS, incisions appear well healing, no TTP Skin: warm and dry   Anti-infectives: Anti-infectives (From admission, onward)   None      Lab Results:  Recent Labs    02/17/17 0333 02/18/17 0446  WBC 6.9 6.2  HGB 12.4 13.1  HCT 35.7* 37.1  PLT 273 268   BMET Recent Labs    02/17/17 0333 02/18/17 0446  NA 138  138 134*  K 3.0*  3.0* 3.0*  CL 113*  112* 103  CO2 19*  20* 24   GLUCOSE 193*  195* 254*  BUN 11  11 <5*  CREATININE 0.43*  0.46 0.41*  CALCIUM 8.3*  8.4* 8.4*   PT/INR No results for input(s): LABPROT, INR in the last 72 hours. CMP     Component Value Date/Time   NA 134 (L) 02/18/2017 0446   K 3.0 (L) 02/18/2017 0446   CL 103 02/18/2017 0446   CO2 24 02/18/2017 0446   GLUCOSE 254 (H) 02/18/2017 0446   BUN <5 (L) 02/18/2017 0446   CREATININE 0.41 (L) 02/18/2017 0446   CALCIUM 8.4 (L) 02/18/2017 0446   PROT 5.7 (L) 02/17/2017 0333   ALBUMIN 3.0 (L) 02/17/2017 0333   AST 60 (H) 02/17/2017 0333   ALT 69 (H) 02/17/2017 0333   ALKPHOS 86 02/17/2017 0333   BILITOT 1.1 02/17/2017 0333   GFRNONAA >60 02/18/2017 0446   GFRAA >60 02/18/2017 0446   Lipase     Component Value Date/Time   LIPASE 36 02/15/2017 1955    Studies/Results: Dg Kayleen MemosUgi W/water Sol Cm  Result Date: 02/17/2017 CLINICAL DATA:  56 year old female with history of nausea and vomiting since 02/15/2017. History of gastric sleeve surgery performed in [place] on Monday February 08, 2017. EXAM: WATER SOLUBLE UPPER GI SERIES TECHNIQUE: Single-column upper GI series was performed using water soluble contrast. CONTRAST:  45mL ISOVUE-300 IOPAMIDOL (ISOVUE-300) INJECTION 61% COMPARISON:  None. FLUOROSCOPY TIME:  Fluoroscopy Time:  0.9 minutes Radiation Exposure  Index (if provided by the fluoroscopic device): 12.7 mGy FINDINGS: Preprocedural KUB demonstrates residual oral contrast material throughout the colon, presumably from recent CT examination. Numerous surgical clips are noted in the epigastric region, presumably from recent gastric sleeve surgery. Upon ingestion of water soluble contrast material, the esophagus was grossly normal in appearance. Contrast readily traversed the gastroesophageal junction. Morphologic changes compatible with recent gastric sleeve surgery are noted in the stomach. In the body of the stomach there was an area of persistent narrowing, best appreciated on image 91  of series 4. Contrast traversed the pylorus readily. Duodenum was grossly normal in appearance. IMPRESSION: 1. Postoperative changes of gastric sleeve surgery noted, with persistent area of luminal narrowing in the mid body of the stomach concerning for an area of residual postoperative edema. The possibility of iatrogenic narrowing in this region is not entirely excluded, and attention on repeat upper GI is suggested in 2-3 weeks to ensure resolution of this finding. Electronically Signed   By: Trudie Reedaniel  Entrikin M.D.   On: 02/17/2017 12:32      Jerre SimonJessica L Focht , [facility] 02/18/2017, 11:52 AM Pager: [phone] Consults: [phone] Mon-Fri 7:00 am-4:30 pm Sat-Sun 7:00 am-11:30 am  Agree with above. UGI narrowing could be problematic. We discussed this some. She has no follow up with the surgeon in [place].  They gave her some disks and papers and told her to follow up with her PCP.  This is not ideal follow up for weight loss surgery.  Ovidio Kinavid Duaine Radin, MD, [facility] Pager: [phone] Office phone:  [phone]

## 2017-02-18 NOTE — Progress Notes (Signed)
Patient ID: Nicole PorterSharon H Kirk, female   DOB: September 21, 1960, 56 y.o.   MRN: [id number]  PROGRESS NOTE    Nicole Kirk  [id number] DOB: September 21, 1960 DOA: 02/15/2017 PCP: Hildred PriestPayne, Susan M, NP   Brief Narrative:  56 year old female with history of diabetes mellitus type 2, hypertension, recent laparoscopic sleeve gastrectomy last Monday in [place] presented with severe nausea and vomiting for 2 days.  Patient was found to be in DKA and started on intravenous fluids and insulin drip.  CT abdomen/pelvis showed sleeve gastrectomy, otherwise unremarkable. General surgery was consulted  Assessment & Plan:   Principal Problem:   DKA, type 2 (HCC) Active Problems:   Status post laparoscopic sleeve gastrectomy  DKA -Status post insulin drip.  Continue long-acting insulin and Accu-Cheks with coverage.  -Still with intermittent nausea and vomiting.  Restart normal saline.  Change Zofran to round the clock for the next 24 hours -Diabetes coordinator following  Status post laparoscopic sleeve gastrectomy -Done on Monday last week in [place] -No abnormal findings on CT scan -General surgery following: Upper GI series done on 12/26 showed persistent area of luminal narrowing in the mid body of the stomach. -Still with intermittent nausea and vomiting.  Change Zofran to around the clock for the next 24 hours.  Diet advancement as per general surgery  Hypokalemia -Replace. Repeat a.m. Labs  Hypomagnesemia -Replace.  Repeat a.m. labs  Leukocytosis -Probably reactive. Resolved  Dehydration -Improving with IV fluids.   DVT prophylaxis: SCDs Code Status:  Full Family Communication: None at bedside Disposition Plan:Home in 1-2 days  Consultants: General surgery Procedures: None  Antimicrobials: None    Subjective: Patient seen and examined at bedside.  She is still intermittently nauseous with vomiting.  She vomited this morning. No fever or abdominal pain currently.   Objective: Vitals:   02/17/17 2026 02/17/17 2234 02/18/17 0245 02/18/17 0558  BP: (!) 145/74 (!) 152/56 (!) 155/59 138/83  Pulse: 79 81 84 80  Resp: 17 17 16 16   Temp: 99 F (37.2 C) 99.4 F (37.4 C) 99.3 F (37.4 C) 99.3 F (37.4 C)  TempSrc: Oral Oral Oral Oral  SpO2:  99% 99% 98%  Weight:      Height:        Intake/Output Summary (Last 24 hours) at 02/18/2017 1255 Last data filed at 02/18/2017 [id number] Gross per 24 hour  Intake 1937.5 ml  Output 753 ml  Net 1184.5 ml   Filed Weights   02/15/17 1926  Weight: 88.9 kg (196 lb)    Examination:  General exam: Appears calm and comfortable.  Respiratory system: Bilateral decreased breath sound at bases Cardiovascular system: S1 & S2 heard, rate controlled  gastrointestinal system: Abdomen is nondistended, soft and nontender. Normal bowel sounds heard. Extremities: No cyanosis, clubbing, edema    Data Reviewed: I have personally reviewed following labs and imaging studies  CBC: Recent Labs  Lab 02/15/17 1955 02/17/17 0333 02/18/17 0446  WBC 12.4* 6.9 6.2  NEUTROABS 10.1* 4.7 4.0  HGB 17.6* 12.4 13.1  HCT 50.1* 35.7* 37.1  MCV 85.3 84.4 83.9  PLT 386 273 268   Basic Metabolic Panel: Recent Labs  Lab 02/15/17 1955  02/16/17 1542 02/16/17 1957 02/16/17 2349 02/17/17 0333 02/18/17 0446  NA 135   < > 139 137 138 138  138 134*  K 4.4   < > 3.5 3.1* 3.0* 3.0*  3.0* 3.0*  CL 106   < > 112* 110 112* 113*  112* 103  CO2 10*   < >  18* 18* 19* 19*  20* 24  GLUCOSE 411*   < > 191* 299* 167* 193*  195* 254*  BUN 28*   < > 16 14 12 11  11  <5*  CREATININE 1.06*   < > 0.62 0.61 0.47 0.43*  0.46 0.41*  CALCIUM 10.3   < > 8.9 8.6* 8.4* 8.3*  8.4* 8.4*  MG 2.4  --   --   --   --  1.7 1.6*   < > = values in this interval not displayed.   GFR: Estimated Creatinine Clearance: 83.1 mL/min (A) (by C-G formula based on SCr of 0.41 mg/dL (L)). Liver Function Tests: Recent Labs  Lab 02/15/17 1955 02/17/17 0333  AST 47* 60*  ALT 62* 69*    ALKPHOS 129* 86  BILITOT 2.1* 1.1  PROT 8.9* 5.7*  ALBUMIN 4.6 3.0*   Recent Labs  Lab 02/15/17 1955  LIPASE 36   No results for input(s): AMMONIA in the last 168 hours. Coagulation Profile: No results for input(s): INR, PROTIME in the last 168 hours. Cardiac Enzymes: No results for input(s): CKTOTAL, CKMB, CKMBINDEX, TROPONINI in the last 168 hours. BNP (last 3 results) No results for input(s): PROBNP in the last 8760 hours. HbA1C: No results for input(s): HGBA1C in the last 72 hours. CBG: Recent Labs  Lab 02/17/17 1144 02/17/17 1744 02/17/17 2142 02/18/17 0735 02/18/17 1201  GLUCAP 166* 234* 247* 242* 230*   Lipid Profile: No results for input(s): CHOL, HDL, LDLCALC, TRIG, CHOLHDL, LDLDIRECT in the last 72 hours. Thyroid Function Tests: No results for input(s): TSH, T4TOTAL, FREET4, T3FREE, THYROIDAB in the last 72 hours. Anemia Panel: No results for input(s): VITAMINB12, FOLATE, FERRITIN, TIBC, IRON, RETICCTPCT in the last 72 hours. Sepsis Labs: Recent Labs  Lab 02/15/17 2104 02/16/17 0032  LATICACIDVEN 2.05* 1.48    Recent Results (from the past 240 hour(s))  MRSA PCR Screening     Status: None   Collection Time: 02/16/17  1:30 PM  Result Value Ref Range Status   MRSA by PCR NEGATIVE NEGATIVE Final    Comment:        The GeneXpert MRSA Assay (FDA approved for NASAL specimens only), is one component of a comprehensive MRSA colonization surveillance program. It is not intended to diagnose MRSA infection nor to guide or monitor treatment for MRSA infections.          Radiology Studies: Dg Ugi W/water Sol Cm  Result Date: 02/17/2017 CLINICAL DATA:  56 year old female with history of nausea and vomiting since 02/15/2017. History of gastric sleeve surgery performed in [place] on Monday February 08, 2017. EXAM: WATER SOLUBLE UPPER GI SERIES TECHNIQUE: Single-column upper GI series was performed using water soluble contrast. CONTRAST:  45mL ISOVUE-300  IOPAMIDOL (ISOVUE-300) INJECTION 61% COMPARISON:  None. FLUOROSCOPY TIME:  Fluoroscopy Time:  0.9 minutes Radiation Exposure Index (if provided by the fluoroscopic device): 12.7 mGy FINDINGS: Preprocedural KUB demonstrates residual oral contrast material throughout the colon, presumably from recent CT examination. Numerous surgical clips are noted in the epigastric region, presumably from recent gastric sleeve surgery. Upon ingestion of water soluble contrast material, the esophagus was grossly normal in appearance. Contrast readily traversed the gastroesophageal junction. Morphologic changes compatible with recent gastric sleeve surgery are noted in the stomach. In the body of the stomach there was an area of persistent narrowing, best appreciated on image 91 of series 4. Contrast traversed the pylorus readily. Duodenum was grossly normal in appearance. IMPRESSION: 1. Postoperative changes of gastric sleeve surgery  noted, with persistent area of luminal narrowing in the mid body of the stomach concerning for an area of residual postoperative edema. The possibility of iatrogenic narrowing in this region is not entirely excluded, and attention on repeat upper GI is suggested in 2-3 weeks to ensure resolution of this finding. Electronically Signed   By: Trudie Reedaniel  Entrikin M.D.   On: 02/17/2017 12:32        Scheduled Meds: . atorvastatin  80 mg Oral q1800  . insulin aspart  0-15 Units Subcutaneous TID WC  . insulin aspart  0-5 Units Subcutaneous QHS  . insulin glargine  10 Units Subcutaneous BID  . potassium chloride  30 mEq Oral Q4H  . protein supplement  2 oz Oral QID   Continuous Infusions:    LOS: 3 days        Glade LloydKshitiz Birney Belshe, MD [facility] Pager [phone]  If 7PM-7AM, please contact night-coverage [email or web address] Password [email or web address] 02/18/2017, 12:55 PM

## 2017-02-18 NOTE — Progress Notes (Addendum)
Inpatient Diabetes Program Recommendations  AACE/ADA: New Consensus Statement on Inpatient Glycemic Control (2015)  Target Ranges:  Prepandial:   less than 140 mg/dL      Peak postprandial:   less than 180 mg/dL (1-2 hours)      Critically ill patients:  140 - 180 mg/dL   Results for [name], Sharra H (MRN [id number]) as of 02/18/2017 08:30  Ref. Range 02/17/2017 11:44 02/17/2017 17:44 02/17/2017 21:42  Glucose-Capillary Latest Ref Range: 65 - 99 mg/dL 409166 (H) 811234 (H) 914247 (H)   Results for [name], Reda H (MRN [id number]) as of 02/18/2017 08:30  Ref. Range 02/18/2017 07:35  Glucose-Capillary Latest Ref Range: 65 - 99 mg/dL 086242 (H)    Admit: DKA/ Patient went to [place] and had laparoscopic sleeve gastrectomy performed on Monday this week. For past 2 days has had severe N/V.  History: DM  Home DM Meds: NPH Insulin 17 units AM/ 15 units PM     Regular Insulin- 25 units TID with meals   Current Meds: Lantus 10 units BID   Novolog Moderate Correction Scale/ SSI (0-15 units) TID AC + HS       MD- Please consider increasing Lantus to 13 units BID (0.3 units/kg dosing)  Can convert back to NPH and Regular Insulins at time of discharge.  Patient has follow-up appt with her ENDO in January.       --Will follow patient during hospitalization--  Ambrose FinlandJeannine Johnston Ishana Blades RN, MSN, CDE Diabetes Coordinator Inpatient Glycemic Control Team Team Pager: [phone] (8a-5p)

## 2017-02-19 ENCOUNTER — Encounter (HOSPITAL_COMMUNITY): Payer: Self-pay | Admitting: Radiology

## 2017-02-19 ENCOUNTER — Inpatient Hospital Stay (HOSPITAL_COMMUNITY): Payer: PRIVATE HEALTH INSURANCE

## 2017-02-19 LAB — BASIC METABOLIC PANEL
Anion gap: 9 (ref 5–15)
BUN: 6 mg/dL (ref 6–20)
CHLORIDE: 102 mmol/L (ref 101–111)
CO2: 26 mmol/L (ref 22–32)
Calcium: 8.6 mg/dL — ABNORMAL LOW (ref 8.9–10.3)
Creatinine, Ser: 0.48 mg/dL (ref 0.44–1.00)
GFR calc non Af Amer: >60 mL/min (ref >60)
Glucose, Bld: 178 mg/dL — ABNORMAL HIGH (ref 65–99)
POTASSIUM: 2.9 mmol/L — AB (ref 3.5–5.1)
SODIUM: 137 mmol/L (ref 135–145)

## 2017-02-19 LAB — CBC WITH DIFFERENTIAL/PLATELET
Basophils Absolute: 0 10*3/uL (ref 0.0–0.1)
Basophils Relative: 0 %
Eosinophils Absolute: 0.1 10*3/uL (ref 0.0–0.7)
Eosinophils Relative: 2 %
HEMATOCRIT: 37.8 % (ref 36.0–46.0)
HEMOGLOBIN: 13.3 g/dL (ref 12.0–15.0)
LYMPHS ABS: 1.3 10*3/uL (ref 0.7–4.0)
LYMPHS PCT: 20 %
MCH: 29.8 pg (ref 26.0–34.0)
MCHC: 35.2 g/dL (ref 30.0–36.0)
MCV: 84.6 fL (ref 78.0–100.0)
Monocytes Absolute: 0.9 10*3/uL (ref 0.1–1.0)
Monocytes Relative: 14 %
NEUTROS PCT: 64 %
Neutro Abs: 4.2 10*3/uL (ref 1.7–7.7)
Platelets: 256 10*3/uL (ref 150–400)
RBC: 4.47 MIL/uL (ref 3.87–5.11)
RDW: 13.1 % (ref 11.5–15.5)
WBC: 6.5 10*3/uL (ref 4.0–10.5)

## 2017-02-19 LAB — GLUCOSE, CAPILLARY
GLUCOSE-CAPILLARY: 170 mg/dL — AB (ref 65–99)
GLUCOSE-CAPILLARY: 176 mg/dL — AB (ref 65–99)
Glucose-Capillary: 117 mg/dL — ABNORMAL HIGH (ref 65–99)
Glucose-Capillary: 167 mg/dL — ABNORMAL HIGH (ref 65–99)

## 2017-02-19 LAB — MAGNESIUM: Magnesium: 1.9 mg/dL (ref 1.7–2.4)

## 2017-02-19 LAB — D-DIMER, QUANTITATIVE (NOT AT ARMC): D DIMER QUANT: 1.48 ug{FEU}/mL — AB (ref 0.00–0.50)

## 2017-02-19 MED ORDER — FAMOTIDINE IN NACL 20-0.9 MG/50ML-% IV SOLN
20.0000 mg | INTRAVENOUS | Status: DC
  Administered 2017-02-19: 20 mg via INTRAVENOUS
  Filled 2017-02-19 (×2): qty 50

## 2017-02-19 MED ORDER — IOPAMIDOL (ISOVUE-370) INJECTION 76%
100.0000 mL | Freq: Once | INTRAVENOUS | Status: AC | PRN
  Administered 2017-02-19: 100 mL via INTRAVENOUS

## 2017-02-19 MED ORDER — IOPAMIDOL (ISOVUE-370) INJECTION 76%
INTRAVENOUS | Status: AC
  Filled 2017-02-19: qty 100

## 2017-02-19 MED ORDER — POTASSIUM CHLORIDE CRYS ER 20 MEQ PO TBCR
40.0000 meq | EXTENDED_RELEASE_TABLET | Freq: Two times a day (BID) | ORAL | Status: DC
  Administered 2017-02-20 (×2): 40 meq via ORAL
  Filled 2017-02-19 (×3): qty 2

## 2017-02-19 NOTE — Progress Notes (Signed)
PROGRESS NOTE    Nicole PorterSharon H Kirk  ZOX:[id number] DOB: 05-21-1960 DOA: 02/15/2017 PCP: Hildred PriestPayne, Susan M, NP      Brief Narrative:  56 year old female with history of diabetes mellitus type 2, hypertension, recent laparoscopic sleeve gastrectomy last Monday in [place] presented with severe nausea and vomiting for 2 days.  Patient was found to be in DKA and started on intravenous fluids and insulin drip.  CT abdomen/pelvis showed sleeve gastrectomy, otherwise unremarkable. General surgery was consulted     Assessment & Plan:  Principal Problem:   DKA, type 2 (HCC) Active Problems:   Status post laparoscopic sleeve gastrectomy   DKA Diabetes DKA resolved.  Still hyperglycemic.  Think at this point, vomiting is not from DKA, more likely complication from surgery in [place]. -Increase Lantus for now -Continue SSI -Plan to switch to home NPH/regular at discharge   Sleeve gastrectomy Vomiting Persistent narowing on GI series.  Discussed with Gen Surg again today.  This will either resolve slowly by itself, and she will be able to tolerate advancing her diet over the next 2-4 days, or she will not and wil require PICC and TPN.  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 -Clears today -Ondansetron for nausea -Nutrition supplements -Consult to Gen Surg, appreciate cares -Maybe esophagitis, will start antacid  Hypertension -Hold ACEi, aspirin for now -Continue statin  Other medications -Continue gabapentin  Cough No URI symptoms.  Recent surgery, long plane ride.  Obesity.   -Check Ddimer -If elevated, check CT chest  Hypokalemia -Check mag -Replete K     DVT prophylaxis: SCDs Code Status: FULL Family Communication: None Disposition Plan: Monitor PO intake over next 1-2 days. If able to tolerate PO, advanec as tolerated and home.  Otherwise, by Monday or Tuesday, will likely need TPN and PICC.   Consultants:   Gen Surg  Procedures:    None  Antimicrobials:   None    Subjective: Still vomiting last night.  No abdominal pain.  No cofnsion, fever, chest pain.  Objective: Vitals:   02/19/17 0514 02/19/17 0852 02/19/17 1408 02/19/17 2039  BP: (!) 146/54 (!) 137/59 (!) 155/76 (!) 159/70  Pulse: 82 89 86 81  Resp: 18 16 16 18   Temp: 97.8 F (36.6 C) 98.6 F (37 C) 98.9 F (37.2 C) 99.6 F (37.6 C)  TempSrc: Oral Oral Oral Oral  SpO2: 97% 99% 99% 97%  Weight: 83 kg (182 lb 15.7 oz)     Height:        Intake/Output Summary (Last 24 hours) at 02/19/2017 2119 Last data filed at 02/19/2017 1027 Gross per 24 hour  Intake 260 ml  Output -  Net 260 ml   Filed Weights   02/15/17 1926 02/19/17 0514  Weight: 88.9 kg (196 lb) 83 kg (182 lb 15.7 oz)    Examination: General appearance: Overweight adult female, alert and in mild distress from nausea.   HEENT: Anicteric, conjunctiva pink, lids and lashes normal. No nasal deformity, discharge, epistaxis.  Lips dry.   Skin: Warm and dry.  No jaundice.  No suspicious rashes or lesions. Cardiac: RRR, nl S1-S2, no murmurs appreciated.  Capillary refill is brisk.  JVP normal.  No LE edema.  Radial pulses 2+ and symmetric. Respiratory: Normal respiratory rate and rhythm.  CTAB without rales or wheezes. Abdomen: Abdomen soft.  Mild nonfocal TTP without guarding. No ascites, distension, hepatosplenomegaly.   MSK: No deformities or effusions. Neuro: Awake and alert.  EOMI, moves all extremities.  Speech fluent.    Psych: Sensorium intact and responding to questions, attention normal. Affect normal.  Judgment and insight appear normal.    Data Reviewed: I have personally reviewed following labs and imaging studies:  CBC: Recent Labs  Lab 02/15/17 1955 02/17/17 0333 02/18/17 0446 02/19/17 0458  WBC 12.4* 6.9 6.2 6.5  NEUTROABS 10.1* 4.7 4.0 4.2  HGB 17.6* 12.4 13.1 13.3  HCT 50.1* 35.7* 37.1 37.8  MCV 85.3 84.4 83.9 84.6  PLT 386 273 268 256   Basic Metabolic  Panel: Recent Labs  Lab 02/15/17 1955  02/16/17 1957 02/16/17 2349 02/17/17 0333 02/18/17 0446 02/19/17 0458  NA 135   < > 137 138 138  138 134* 137  K 4.4   < > 3.1* 3.0* 3.0*  3.0* 3.0* 2.9*  CL 106   < > 110 112* 113*  112* 103 102  CO2 10*   < > 18* 19* 19*  20* 24 26  GLUCOSE 411*   < > 299* 167* 193*  195* 254* 178*  BUN 28*   < > 14 12 11  11  <5* 6  CREATININE 1.06*   < > 0.61 0.47 0.43*  0.46 0.41* 0.48  CALCIUM 10.3   < > 8.6* 8.4* 8.3*  8.4* 8.4* 8.6*  MG 2.4  --   --   --  1.7 1.6* 1.9   < > = values in this interval not displayed.   GFR: Estimated Creatinine Clearance: 80.1 mL/min (by C-G formula based on SCr of 0.48 mg/dL). Liver Function Tests: Recent Labs  Lab 02/15/17 1955 02/17/17 0333  AST 47* 60*  ALT 62* 69*  ALKPHOS 129* 86  BILITOT 2.1* 1.1  PROT 8.9* 5.7*  ALBUMIN 4.6 3.0*   Recent Labs  Lab 02/15/17 1955  LIPASE 36   No results for input(s): AMMONIA in the last 168 hours. Coagulation Profile: No results for input(s): INR, PROTIME in the last 168 hours. Cardiac Enzymes: No results for input(s): CKTOTAL, CKMB, CKMBINDEX, TROPONINI in the last 168 hours. BNP (last 3 results) No results for input(s): PROBNP in the last 8760 hours. HbA1C: No results for input(s): HGBA1C in the last 72 hours. CBG: Recent Labs  Lab 02/18/17 1724 02/18/17 2135 02/19/17 0745 02/19/17 1223 02/19/17 1629  GLUCAP 178* 165* 170* 176* 117*   Lipid Profile: No results for input(s): CHOL, HDL, LDLCALC, TRIG, CHOLHDL, LDLDIRECT in the last 72 hours. Thyroid Function Tests: No results for input(s): TSH, T4TOTAL, FREET4, T3FREE, THYROIDAB in the last 72 hours. Anemia Panel: No results for input(s): VITAMINB12, FOLATE, FERRITIN, TIBC, IRON, RETICCTPCT in the last 72 hours. Urine analysis: No results found for: COLORURINE, APPEARANCEUR, LABSPEC, PHURINE, GLUCOSEU, HGBUR, BILIRUBINUR, KETONESUR, PROTEINUR, UROBILINOGEN, NITRITE, LEUKOCYTESUR Sepsis  Labs: @LABRCNTIP (procalcitonin:4,lacticacidven:4)  ) Recent Results (from the past 240 hour(s))  MRSA PCR Screening     Status: None   Collection Time: 02/16/17  1:30 PM  Result Value Ref Range Status   MRSA by PCR NEGATIVE NEGATIVE Final    Comment:        The GeneXpert MRSA Assay (FDA approved for NASAL specimens only), is one component of a comprehensive MRSA colonization surveillance program. It is not intended to diagnose MRSA infection nor to guide or monitor treatment for MRSA infections.          Radiology Studies: Ct Angio Chest Pe W Or Wo Contrast  Result Date: 02/19/2017 CLINICAL DATA:  Recent sleeve gastrectomy. Now with nausea and vomiting. Diabetes and hypertension. Rule out  pulmonary embolism. EXAM: CT ANGIOGRAPHY CHEST WITH CONTRAST TECHNIQUE: Multidetector CT imaging of the chest was performed using the standard protocol during bolus administration of intravenous contrast. Multiplanar CT image reconstructions and MIPs were obtained to evaluate the vascular anatomy. CONTRAST:  100mL ISOVUE-370 IOPAMIDOL (ISOVUE-370) INJECTION 76% COMPARISON:  None. FINDINGS: Cardiovascular: Negative for pulmonary embolism. Pulmonary artery is normal in caliber. Minimal atherosclerotic disease aortic arch without aneurysm or dissection. Coronary artery calcification is mild. Mediastinum/Nodes: Diffuse thickening of the esophagus, possible esophagitis. Small hiatal hernia No mass or adenopathy. Lungs/Pleura: Negative Upper Abdomen: Recent gastric surgery without surrounding fluid or hematoma Musculoskeletal: Mild degenerative changes thoracic Spine Review of the MIP images confirms the above findings. IMPRESSION: Negative for pulmonary embolism Esophageal thickening, possible esophagitis Electronically Signed   By: Marlan Palauharles  Clark M.D.   On: 02/19/2017 15:19        Scheduled Meds: . atorvastatin  80 mg Oral q1800  . insulin aspart  0-15 Units Subcutaneous TID WC  . insulin aspart   0-5 Units Subcutaneous QHS  . insulin glargine  13 Units Subcutaneous BID  . iopamidol      . ondansetron (ZOFRAN) IV  4 mg Intravenous Q6H  . protein supplement  2 oz Oral QID   Continuous Infusions:   LOS: 4 days    Time spent: 45 minutes    Alberteen Samhristopher P Sofiya Ezelle, MD [facility] 02/19/2017, 11:00 AM     Pager [phone] --- please page though AMION:  [email or web address] Password [email or web address] If 7PM-7AM, please contact night-coverage

## 2017-02-19 NOTE — Progress Notes (Signed)
Patient ID: Nicole PorterSharon H Kirk, female   DOB: 10/27/1960, 56 y.o.   MRN: [id number]     Subjective: Vomited once early this morning.  Has since been able to get down 4-6 ounces of fluid without vomiting.  Denies pain.  Objective: Vital signs in last 24 hours: Temp:  [97.8 F (36.6 C)-99.2 F (37.3 C)] 98.6 F (37 C) (12/28 0852) Pulse Rate:  [72-89] 89 (12/28 0852) Resp:  [16-18] 16 (12/28 0852) BP: (137-149)/(54-72) 137/59 (12/28 0852) SpO2:  [97 %-99 %] 99 % (12/28 0852) Weight:  [83 kg (182 lb 15.7 oz)] 83 kg (182 lb 15.7 oz) (12/28 0514) Last BM Date: 02/19/17  Intake/Output from previous day: 12/27 0701 - 12/28 0700 In: 240 [P.O.:240] Out: 650 [Urine:650] Intake/Output this shift: Total I/O In: 200 [P.O.:200] Out: -   General appearance: alert, cooperative and no distress GI: normal findings: soft, non-tender Incision/Wound: No erythema or drainage  Lab Results:  Recent Labs    02/18/17 0446 02/19/17 0458  WBC 6.2 6.5  HGB 13.1 13.3  HCT 37.1 37.8  PLT 268 256   BMET Recent Labs    02/18/17 0446 02/19/17 0458  NA 134* 137  K 3.0* 2.9*  CL 103 102  CO2 24 26  GLUCOSE 254* 178*  BUN <5* 6  CREATININE 0.41* 0.48  CALCIUM 8.4* 8.6*   CBG (last 3)  Recent Labs    02/18/17 2135 02/19/17 0745 02/19/17 1223  GLUCAP 165* 170* 176*     Studies/Results: No results found.  Anti-infectives: Anti-infectives (From admission, onward)   None      Assessment/Plan: Approaching 2 weeks status post sleeve gastrectomy in [place]. Admitted with persistent nausea and vomiting and DKA.  Metabolically she has been stabilized.  Upper GI series shows a persistent area of narrowing near the incisura which could be secondary to edema or more fixed stricture.  Continue observation and trial of clear liquids for now.  If does not improve soon might need TNA with more prolonged observation and upper endoscopy for further evaluation.  Discussed with patient and all  questions answered.    LOS: 4 days    Mariella SaaBenjamin T Jaykob Minichiello 02/19/2017

## 2017-02-20 DIAGNOSIS — E876 Hypokalemia: Secondary | ICD-10-CM

## 2017-02-20 LAB — CBC
HEMATOCRIT: 38.3 % (ref 36.0–46.0)
Hemoglobin: 13.3 g/dL (ref 12.0–15.0)
MCH: 29.6 pg (ref 26.0–34.0)
MCHC: 34.7 g/dL (ref 30.0–36.0)
MCV: 85.3 fL (ref 78.0–100.0)
Platelets: 263 10*3/uL (ref 150–400)
RBC: 4.49 MIL/uL (ref 3.87–5.11)
RDW: 13.2 % (ref 11.5–15.5)
WBC: 7.4 10*3/uL (ref 4.0–10.5)

## 2017-02-20 LAB — GLUCOSE, CAPILLARY
GLUCOSE-CAPILLARY: 124 mg/dL — AB (ref 65–99)
GLUCOSE-CAPILLARY: 221 mg/dL — AB (ref 65–99)
Glucose-Capillary: 194 mg/dL — ABNORMAL HIGH (ref 65–99)
Glucose-Capillary: 167 mg/dL — ABNORMAL HIGH (ref 65–99)

## 2017-02-20 MED ORDER — PREMIER PROTEIN SHAKE
11.0000 [oz_av] | Freq: Four times a day (QID) | ORAL | Status: DC
  Administered 2017-02-20 (×2): 11 [oz_av] via ORAL

## 2017-02-20 MED ORDER — FAMOTIDINE 20 MG PO TABS
20.0000 mg | ORAL_TABLET | Freq: Every day | ORAL | Status: DC
  Administered 2017-02-20: 20 mg via ORAL
  Filled 2017-02-20: qty 1

## 2017-02-20 MED ORDER — GUAIFENESIN-DM 100-10 MG/5ML PO SYRP: 5.0000 mL | ORAL_SOLUTION | ORAL | Status: DC | PRN

## 2017-02-20 NOTE — Progress Notes (Signed)
Pt still having diarrhea, said she had 5 episodes during the night and she has had a persistent dry cough for the past 2 days.

## 2017-02-20 NOTE — Progress Notes (Signed)
Patient ID: Rolland PorterSharon H Kirk, female   DOB: [birth year], 56 y.o.   MRN: [id number]     Subjective: Feels better today.  Tolerated a moderate amount of clear liquids yesterday without nausea or vomiting or abdominal pain.  She is having some diarrhea, about 6 loose stools per day.  Objective: Vital signs in last 24 hours: Temp:  [98.8 F (37.1 C)-99.6 F (37.6 C)] 98.8 F (37.1 C) (12/29 0620) Pulse Rate:  [81-86] 85 (12/29 0620) Resp:  [16-18] 18 (12/29 0620) BP: (135-159)/(59-76) 135/59 (12/29 0620) SpO2:  [97 %-99 %] 98 % (12/29 0620) Last BM Date: 02/20/17  Intake/Output from previous day: 12/28 0701 - 12/29 0700 In: 200 [P.O.:200] Out: -  Intake/Output this shift: No intake/output data recorded.  General appearance: alert, cooperative and no distress GI: normal findings: soft, non-tender and Nondistended Incision/Wound: No erythema or drainage  Lab Results:  Recent Labs    02/19/17 0458 02/20/17 0517  WBC 6.5 7.4  HGB 13.3 13.3  HCT 37.8 38.3  PLT 256 263   BMET Recent Labs    02/18/17 0446 02/19/17 0458  NA 134* 137  K 3.0* 2.9*  CL 103 102  CO2 24 26  GLUCOSE 254* 178*  BUN <5* 6  CREATININE 0.41* 0.48  CALCIUM 8.4* 8.6*   CBG (last 3)  Recent Labs    02/19/17 1629 02/19/17 2136 02/20/17 0729  GLUCAP 117* 167* 194*     Studies/Results: Ct Angio Chest Pe W Or Wo Contrast  Result Date: 02/19/2017 CLINICAL DATA:  Recent sleeve gastrectomy. Now with nausea and vomiting. Diabetes and hypertension. Rule out pulmonary embolism. EXAM: CT ANGIOGRAPHY CHEST WITH CONTRAST TECHNIQUE: Multidetector CT imaging of the chest was performed using the standard protocol during bolus administration of intravenous contrast. Multiplanar CT image reconstructions and MIPs were obtained to evaluate the vascular anatomy. CONTRAST:  100mL ISOVUE-370 IOPAMIDOL (ISOVUE-370) INJECTION 76% COMPARISON:  None. FINDINGS: Cardiovascular: Negative for pulmonary embolism. Pulmonary  artery is normal in caliber. Minimal atherosclerotic disease aortic arch without aneurysm or dissection. Coronary artery calcification is mild. Mediastinum/Nodes: Diffuse thickening of the esophagus, possible esophagitis. Small hiatal hernia No mass or adenopathy. Lungs/Pleura: Negative Upper Abdomen: Recent gastric surgery without surrounding fluid or hematoma Musculoskeletal: Mild degenerative changes thoracic Spine Review of the MIP images confirms the above findings. IMPRESSION: Negative for pulmonary embolism Esophageal thickening, possible esophagitis Electronically Signed   By: Marlan Palauharles  Clark M.D.   On: 02/19/2017 15:19    Anti-infectives: Anti-infectives (From admission, onward)   None      Assessment/Plan: 2 weeks status post sleeve gastrectomy in [place] with area of narrowing of the sleeve at the incisura.  She is clinically improved today and hopefully this is secondary to edema and will resolve. Start protein shakes Hypokalemia-replace Blood sugar under better control.    LOS: 5 days    Nicole SaaBenjamin T Tiasia Weberg 02/20/2017

## 2017-02-20 NOTE — Progress Notes (Addendum)
PROGRESS NOTE    Nicole Kirk  WUJ:[id number] DOB: 1960-05-09 DOA: 02/15/2017 PCP: Hildred PriestPayne, Susan M, NP      Brief Narrative:  56 year old female with history of diabetes mellitus type 2, hypertension, recent laparoscopic sleeve gastrectomy last Monday in [place] presented with severe nausea and vomiting for 2 days.  Patient was found to be in DKA and started on intravenous fluids and insulin drip.  CT abdomen/pelvis showed sleeve gastrectomy, otherwise unremarkable. General surgery was consulted     Assessment & Plan:  Principal Problem:   DKA, type 2 (HCC) Active Problems:   Status post laparoscopic sleeve gastrectomy   DKA Diabetes DKA resolved. Think at this point, vomiting is not from DKA, viral GE vs edema or stricture due to sleeve.   -Continue Lantus -Continue SSI -Plan to switch to home NPH/regular at discharge   Sleeve gastrectomy Vomiting Persistent narrowing on GI series.  This will either resolve slowly by itself, and she will be able to tolerate advancing her diet over the next 2-4 days, or she will not and will require PICC and TPN.  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  Seems better today. CT yesterday showed esophagitis.  Her case worker at her bariatric clinic in [place] is Tobi Bastosnna at [phone].  I called today, no answer, left VM. -Clears today and protein shakes per Gen Surg, appreciate cares -Ondansetron for nausea -Continue Pepcid, change to PO  Diarrhea Unclear cause. Colitis? -Check stool panel  Hypertension -Hold ACEi, aspirin for now -Continue statin  Other medications -Continue gabapentin  Cough No URI symptoms.  Recent surgery, long plane ride.  Obesity.  CT shows no PE. -Robitussin  PRN  Hypokalemia -Replete K -Trend BMP     DVT prophylaxis: SCDs Code Status: FULL Family Communication: None Disposition Plan: Patient taking good PO today.  If she is able to advance her diet over the next 2 days,  suspect this is resolving edema from her surgery, possibly reflux esophagitis or viral GE in which case she can likely be discharged by Monday or Tuesday.  If persistent vomiting were to happen, per Gen Surg, endscopic dilation is not an option.  She would likely need to get PICC and TPN for 6 weeks, to trial food again, and at that point either fly back to her surgeon for revision or undergo revision here in [place].   Consultants:   Gen Surg  Procedures:   None  Antimicrobials:   None    Subjective: No vomiting last 24 hours.  Diarrhea still up to 6 times yesterdasy.  No new confusion, abdominal pain, fever, chest pain, dyspnea.  Cough mostly resolved.  No leg swelling.     Objective: Vitals:   02/19/17 2039 02/20/17 0620 02/20/17 1000 02/20/17 1318  BP: (!) 159/70 (!) 135/59 134/65 132/60  Pulse: 81 85 82 78  Resp: 18 18 18 18   Temp: 99.6 F (37.6 C) 98.8 F (37.1 C) 98.3 F (36.8 C) 98.9 F (37.2 C)  TempSrc: Oral Oral Oral Oral  SpO2: 97% 98% 98% 98%  Weight:      Height:       No intake or output data in the 24 hours ending 02/20/17 1420 Filed Weights   02/15/17 1926 02/19/17 0514  Weight: 88.9 kg (196 lb) 83 kg (182 lb 15.7 oz)    Examination: General appearance: Overweight adult female, alert and in mild distress from nausea.   HEENT: Anicteric, conjunctiva pink, lids and lashes normal. No  nasal deformity, discharge, epistaxis.  Lips dry.   Skin: Warm and dry.  No jaundice.  No suspicious rashes or lesions. Cardiac: RRR, nl S1-S2, no murmurs appreciated.  Capillary refill is brisk.  JVP normal.  No LE edema.  Radial pulses 2+ and symmetric. Respiratory: Normal respiratory rate and rhythm.  CTAB without rales or wheezes. Abdomen: Abdomen soft.  No TTP. No ascites, distension, hepatosplenomegaly.   MSK: No deformities or effusions.  No calf tenderness. Neuro: Awake and alert.  EOMI, moves all extremities. Speech fluent.    Psych: Sensorium intact and  responding to questions, attention normal. Affect normal.  Judgment and insight appear normal.    Data Reviewed: I have personally reviewed following labs and imaging studies:  CBC: Recent Labs  Lab 02/15/17 1955 02/17/17 0333 02/18/17 0446 02/19/17 0458 02/20/17 0517  WBC 12.4* 6.9 6.2 6.5 7.4  NEUTROABS 10.1* 4.7 4.0 4.2  --   HGB 17.6* 12.4 13.1 13.3 13.3  HCT 50.1* 35.7* 37.1 37.8 38.3  MCV 85.3 84.4 83.9 84.6 85.3  PLT 386 273 268 256 263   Basic Metabolic Panel: Recent Labs  Lab 02/15/17 1955  02/16/17 1957 02/16/17 2349 02/17/17 0333 02/18/17 0446 02/19/17 0458  NA 135   < > 137 138 138  138 134* 137  K 4.4   < > 3.1* 3.0* 3.0*  3.0* 3.0* 2.9*  CL 106   < > 110 112* 113*  112* 103 102  CO2 10*   < > 18* 19* 19*  20* 24 26  GLUCOSE 411*   < > 299* 167* 193*  195* 254* 178*  BUN 28*   < > 14 12 11  11  <5* 6  CREATININE 1.06*   < > 0.61 0.47 0.43*  0.46 0.41* 0.48  CALCIUM 10.3   < > 8.6* 8.4* 8.3*  8.4* 8.4* 8.6*  MG 2.4  --   --   --  1.7 1.6* 1.9   < > = values in this interval not displayed.   GFR: Estimated Creatinine Clearance: 80.1 mL/min (by C-G formula based on SCr of 0.48 mg/dL). Liver Function Tests: Recent Labs  Lab 02/15/17 1955 02/17/17 0333  AST 47* 60*  ALT 62* 69*  ALKPHOS 129* 86  BILITOT 2.1* 1.1  PROT 8.9* 5.7*  ALBUMIN 4.6 3.0*   Recent Labs  Lab 02/15/17 1955  LIPASE 36   No results for input(s): AMMONIA in the last 168 hours. Coagulation Profile: No results for input(s): INR, PROTIME in the last 168 hours. Cardiac Enzymes: No results for input(s): CKTOTAL, CKMB, CKMBINDEX, TROPONINI in the last 168 hours. BNP (last 3 results) No results for input(s): PROBNP in the last 8760 hours. HbA1C: No results for input(s): HGBA1C in the last 72 hours. CBG: Recent Labs  Lab 02/19/17 1223 02/19/17 1629 02/19/17 2136 02/20/17 0729 02/20/17 1142  GLUCAP 176* 117* 167* 194* 167*   Lipid Profile: No results for  input(s): CHOL, HDL, LDLCALC, TRIG, CHOLHDL, LDLDIRECT in the last 72 hours. Thyroid Function Tests: No results for input(s): TSH, T4TOTAL, FREET4, T3FREE, THYROIDAB in the last 72 hours. Anemia Panel: No results for input(s): VITAMINB12, FOLATE, FERRITIN, TIBC, IRON, RETICCTPCT in the last 72 hours. Urine analysis: No results found for: COLORURINE, APPEARANCEUR, LABSPEC, PHURINE, GLUCOSEU, HGBUR, BILIRUBINUR, KETONESUR, PROTEINUR, UROBILINOGEN, NITRITE, LEUKOCYTESUR Sepsis Labs: @LABRCNTIP (procalcitonin:4,lacticacidven:4)  ) Recent Results (from the past 240 hour(s))  MRSA PCR Screening     Status: None   Collection Time: 02/16/17  1:30 PM  Result  Value Ref Range Status   MRSA by PCR NEGATIVE NEGATIVE Final    Comment:        The GeneXpert MRSA Assay (FDA approved for NASAL specimens only), is one component of a comprehensive MRSA colonization surveillance program. It is not intended to diagnose MRSA infection nor to guide or monitor treatment for MRSA infections.          Radiology Studies: Ct Angio Chest Pe W Or Wo Contrast  Result Date: 02/19/2017 CLINICAL DATA:  Recent sleeve gastrectomy. Now with nausea and vomiting. Diabetes and hypertension. Rule out pulmonary embolism. EXAM: CT ANGIOGRAPHY CHEST WITH CONTRAST TECHNIQUE: Multidetector CT imaging of the chest was performed using the standard protocol during bolus administration of intravenous contrast. Multiplanar CT image reconstructions and MIPs were obtained to evaluate the vascular anatomy. CONTRAST:  100mL ISOVUE-370 IOPAMIDOL (ISOVUE-370) INJECTION 76% COMPARISON:  None. FINDINGS: Cardiovascular: Negative for pulmonary embolism. Pulmonary artery is normal in caliber. Minimal atherosclerotic disease aortic arch without aneurysm or dissection. Coronary artery calcification is mild. Mediastinum/Nodes: Diffuse thickening of the esophagus, possible esophagitis. Small hiatal hernia No mass or adenopathy. Lungs/Pleura:  Negative Upper Abdomen: Recent gastric surgery without surrounding fluid or hematoma Musculoskeletal: Mild degenerative changes thoracic Spine Review of the MIP images confirms the above findings. IMPRESSION: Negative for pulmonary embolism Esophageal thickening, possible esophagitis Electronically Signed   By: Marlan Palauharles  Clark M.D.   On: 02/19/2017 15:19        Scheduled Meds: . atorvastatin  80 mg Oral q1800  . insulin aspart  0-15 Units Subcutaneous TID WC  . insulin aspart  0-5 Units Subcutaneous QHS  . insulin glargine  13 Units Subcutaneous BID  . ondansetron (ZOFRAN) IV  4 mg Intravenous Q6H  . potassium chloride  40 mEq Oral BID  . protein supplement shake  11 oz Oral QID  . protein supplement  2 oz Oral QID   Continuous Infusions: . famotidine (PEPCID) IV Stopped (02/20/17 0518)     LOS: 5 days    Time spent: 25 minutes    Alberteen Samhristopher P Danford, MD [facility] 02/20/2017, 11:00 AM     Pager [phone] --- please page though AMION:  [email or web address] If 7PM-7AM, please contact night-coverage

## 2017-02-21 LAB — GASTROINTESTINAL PANEL BY PCR, STOOL (REPLACES STOOL CULTURE)

## 2017-02-21 LAB — CBC
HCT: 39.3 % (ref 36.0–46.0)
Hemoglobin: 13.4 g/dL (ref 12.0–15.0)
MCH: 29.3 pg (ref 26.0–34.0)
MCHC: 34.1 g/dL (ref 30.0–36.0)
MCV: 86 fL (ref 78.0–100.0)
PLATELETS: 277 10*3/uL (ref 150–400)
RBC: 4.57 MIL/uL (ref 3.87–5.11)
RDW: 12.9 % (ref 11.5–15.5)
WBC: 6.9 10*3/uL (ref 4.0–10.5)

## 2017-02-21 LAB — BASIC METABOLIC PANEL
Anion gap: 9 (ref 5–15)
BUN: 9 mg/dL (ref 6–20)
CALCIUM: 8.6 mg/dL — AB (ref 8.9–10.3)
CO2: 27 mmol/L (ref 22–32)
CREATININE: 0.4 mg/dL — AB (ref 0.44–1.00)
Chloride: 103 mmol/L (ref 101–111)
Glucose, Bld: 149 mg/dL — ABNORMAL HIGH (ref 65–99)
Potassium: 3.4 mmol/L — ABNORMAL LOW (ref 3.5–5.1)
SODIUM: 139 mmol/L (ref 135–145)

## 2017-02-21 LAB — GLUCOSE, CAPILLARY
GLUCOSE-CAPILLARY: 185 mg/dL — AB (ref 65–99)
Glucose-Capillary: 153 mg/dL — ABNORMAL HIGH (ref 65–99)

## 2017-02-21 LAB — C DIFFICILE QUICK SCREEN W PCR REFLEX
C DIFFICILE (CDIFF) INTERP: NOT DETECTED
C DIFFICILE (CDIFF) TOXIN: NEGATIVE
C DIFFICLE (CDIFF) ANTIGEN: NEGATIVE

## 2017-02-21 MED ORDER — FAMOTIDINE 20 MG PO TABS: 20.0000 mg | ORAL_TABLET | Freq: Every day | ORAL | 0 refills | Status: AC

## 2017-02-21 MED ORDER — INSULIN NPH (HUMAN) (ISOPHANE) 100 UNIT/ML ~~LOC~~ SUSP: 13.0000 [IU] | Freq: Two times a day (BID) | SUBCUTANEOUS | 11 refills | Status: AC

## 2017-02-21 MED ORDER — INSULIN REGULAR HUMAN 100 UNIT/ML IJ SOLN: 3.0000 [IU] | Freq: Three times a day (TID) | INTRAMUSCULAR | 11 refills | Status: AC

## 2017-02-21 NOTE — Discharge Summary (Signed)
Physician Discharge Summary  Nicole Kirk LZJ:[id number] DOB: 10-Oct-1960 DOA: 02/15/2017  PCP: Nicole Priest, NP  Admit date: 02/15/2017 Discharge date: 02/21/2017  Admitted From: Home  Disposition:  Vomiting, malaise   Recommendations for Outpatient Follow-up:  1. Follow up with PCP in 1-2 weeks 2. Please obtain BMP/CBC in one week   Home Health: None  Equipment/Devices: None  Discharge Condition: Good  CODE STATUS: FULL Diet recommendation: Soft  Brief/Interim Summary: Nicole Kirk is a 56 yo F with obesity, IDDM, and HTN who recently underwent sleeve gastrectomy in [place] on 02/05/2017.  She had an uneventful post-operative course per her report and flew home before Christmas.  There she developed progressive vomiting over 2 days and came to the ER where she was found to be in DKA.   DKA Treated with insulin drip.  Transitioned to Subq insulin uneventfully.  Discharged on much lower insulin regimen than prior to surgery, with close PCP follow up.   Possible stricture of sleeve Even after resolution of DKA, the patient had persistent vomiting.  Gastrograffin UGI series showed possible edema vs stricture of her sleeve.  Gen Surg were consulted, our bariatric team, who recommended against dilation endoscopically.  After 2-3 days of IV fluids and watchful waiting, the patient was able to tolerate clears well and advance to a soft diet and it was felt that her x-ray finding was just edema, now resolved.  Diarrhea She had persistent diarrhea as well, but testing was negative for Cdiff or other typical GI pathogens.       Discharge Diagnoses:  Principal Problem:   DKA, type 2 (HCC) Active Problems:   Status post laparoscopic sleeve gastrectomy    Discharge Instructions  Discharge Instructions    Diet Carb Modified   Complete by:  As directed    Discharge instructions   Complete by:  As directed    From Dr. Maryfrances Kirk: You were admitted for DKA/diabetic ketoacidosis.  This actually resolved pretty quickly, and what kept you in the hospital was the persistent vomiting, and our concern that your sleeve had a stricture.  Specifically: you had a gastrograffin abdomen x-ray/upper GI series that showed "gastric sleeve surgery noted, with persistent area of luminal narrowing in the mid body of the stomach concerning for an area of residual postoperative edema. The possibility of iatrogenic narrowing in this region is not entirelyexcluded, and attention on repeat upper GI is suggested in 2-3 weeks."  Given that you have been able to advance your diet this last two days, it seems that whatever swelling we saw on your x-rays is resolving.   Please see your primary care doctor within 1 week.  Your primary care doctor can order the repeat "upper GI series" with water soluble contrast (gastrograffin) for mid-January, to follow up.  For your diet, Dr. Johna Kirk recmomends you maintain a liquid diet for a few more days, before advancing your diet as recommended by your bariatric surgeon.   FOR YOUR DIABETES: Your insulin requirements seem to be much less.   For now, take NPH 13 units twice daily int he morning and night Short-acting insulin 3 units with each meal, plus sliding scale  If your sugars are persistently less than 100, start to decrease all the doses by 1-2 units.  If all the sugars are persistently >200, increase all doses.   FOR YOUR BLOOD PRESSURE: It is okay to hold lisinopril until you see your primary care doctor.   Increase activity slowly   Complete by:  As directed      Allergies as of 02/21/2017      Reactions   Rocephin [ceftriaxone Sodium In Dextrose] Rash      Medication List    STOP taking these medications   lisinopril 10 MG tablet Commonly known as:  PRINIVIL,ZESTRIL     TAKE these medications   aspirin EC 81 MG tablet Take 81 mg by mouth daily.   atorvastatin 80 MG tablet Commonly known as:  LIPITOR Take 80 mg by mouth  daily.   famotidine 20 MG tablet Commonly known as:  PEPCID Take 1 tablet (20 mg total) by mouth at bedtime.   gabapentin 100 MG capsule Commonly known as:  NEURONTIN Take 100 mg by mouth at bedtime as needed (foot pain).   insulin NPH Human 100 UNIT/ML injection Commonly known as:  HUMULIN N,NOVOLIN N Inject 0.13 mLs (13 Units total) into the skin 2 (two) times daily before a meal. What changed:    how much to take  additional instructions   insulin regular 100 units/mL injection Commonly known as:  NOVOLIN R,HUMULIN R Inject 0.03 mLs (3 Units total) into the skin 3 (three) times daily before meals. What changed:  how much to take       Allergies  Allergen Reactions  . Rocephin [Ceftriaxone Sodium In Dextrose] Rash    Consultations:  Gen Surg   Procedures/Studies: Ct Angio Chest Pe W Or Wo Contrast  Result Date: 02/19/2017 CLINICAL DATA:  Recent sleeve gastrectomy. Now with nausea and vomiting. Diabetes and hypertension. Rule out pulmonary embolism. EXAM: CT ANGIOGRAPHY CHEST WITH CONTRAST TECHNIQUE: Multidetector CT imaging of the chest was performed using the standard protocol during bolus administration of intravenous contrast. Multiplanar CT image reconstructions and MIPs were obtained to evaluate the vascular anatomy. CONTRAST:  ISOVUE-370 IOPAMIDOL (ISOVUE-370) INJECTION 76% COMPARISON:  None. FINDINGS: Cardiovascular: Negative for pulmonary embolism. Pulmonary artery is normal in caliber. Minimal atherosclerotic disease aortic arch without aneurysm or dissection. Coronary artery calcification is mild. Mediastinum/Nodes: Diffuse thickening of the esophagus, possible esophagitis. Small hiatal hernia No mass or adenopathy. Lungs/Pleura: Negative Upper Abdomen: Recent gastric surgery without surrounding fluid or hematoma Musculoskeletal: Mild degenerative changes thoracic Spine Review of the MIP images confirms the above findings. IMPRESSION: Negative for pulmonary  embolism Esophageal thickening, possible esophagitis Electronically Signed   By: Marlan Palau M.D.   On: 02/19/2017 15:19   Ct Abdomen Pelvis W Contrast  Result Date: 02/15/2017 CLINICAL DATA:  Nausea and vomiting EXAM: CT ABDOMEN AND PELVIS WITH CONTRAST TECHNIQUE: Multidetector CT imaging of the abdomen and pelvis was performed using the standard protocol following bolus administration of intravenous contrast. CONTRAST:  ISOVUE-300 IOPAMIDOL (ISOVUE-300) INJECTION 61% COMPARISON:  None. FINDINGS: Lower chest: No pulmonary nodules or pleural effusion. No visible pericardial effusion. Hepatobiliary: Normal hepatic contours and density. No visible biliary dilatation. Normal gallbladder. Pancreas: Normal contours without ductal dilatation. No peripancreatic fluid collection. Spleen: Normal. Adrenals/Urinary Tract: --Adrenal glands: Normal. --Right kidney/ureter: No hydronephrosis or perinephric stranding. No nephrolithiasis. No obstructing ureteral stones. --Left kidney/ureter: No hydronephrosis or perinephric stranding. No nephrolithiasis. No obstructing ureteral stones. --Urinary bladder: Unremarkable. Stomach/Bowel: --Stomach/Duodenum: Status post sleeve gastrectomy. Small hiatal hernia. Normal duodenum. --Small bowel: No dilatation or inflammation. --Colon: No focal abnormality. --Appendix: Normal. Vascular/Lymphatic: Normal course and caliber of the major abdominal vessels. No abdominal or pelvic lymphadenopathy. Reproductive: Normal uterus and ovaries. Musculoskeletal. No bony spinal canal stenosis or focal osseous abnormality. Other: None. IMPRESSION: 1. No acute abdominopelvic abnormality. 2. Status post  sleeve gastrectomy with small hiatal hernia. 3.  Aortic Atherosclerosis (ICD10-I70.0). Electronically Signed   By: Deatra RobinsonKevin  Herman M.D.   On: 02/15/2017 22:28   Dg Kayleen MemosUgi W/water Sol Cm  Result Date: 02/17/2017 CLINICAL DATA:  56 year old female with history of nausea and vomiting since  02/15/2017. History of gastric sleeve surgery performed in [place] on Monday February 08, 2017. EXAM: WATER SOLUBLE UPPER GI SERIES TECHNIQUE: Single-column upper GI series was performed using water soluble contrast. CONTRAST:  45mL ISOVUE-300 IOPAMIDOL (ISOVUE-300) INJECTION 61% COMPARISON:  None. FLUOROSCOPY TIME:  Fluoroscopy Time:  0.9 minutes Radiation Exposure Index (if provided by the fluoroscopic device): 12.7 mGy FINDINGS: Preprocedural KUB demonstrates residual oral contrast material throughout the colon, presumably from recent CT examination. Numerous surgical clips are noted in the epigastric region, presumably from recent gastric sleeve surgery. Upon ingestion of water soluble contrast material, the esophagus was grossly normal in appearance. Contrast readily traversed the gastroesophageal junction. Morphologic changes compatible with recent gastric sleeve surgery are noted in the stomach. In the body of the stomach there was an area of persistent narrowing, best appreciated on image 91 of series 4. Contrast traversed the pylorus readily. Duodenum was grossly normal in appearance. IMPRESSION: 1. Postoperative changes of gastric sleeve surgery noted, with persistent area of luminal narrowing in the mid body of the stomach concerning for an area of residual postoperative edema. The possibility of iatrogenic narrowing in this region is not entirely excluded, and attention on repeat upper GI is suggested in 2-3 weeks to ensure resolution of this finding. Electronically Signed   By: Trudie Reedaniel  Entrikin M.D.   On: 02/17/2017 12:32       Subjective: Feels well.  Appetite good.  No vomiting, no abdominal pain.  No amlaise, fever, confusion.  Discharge Exam: Vitals:   02/21/17 0624 02/21/17 1337  BP: 134/63 130/63  Pulse: 74 83  Resp: 18 16  Temp: 98.3 F (36.8 C) 98.3 F (36.8 C)  SpO2: 95% 99%   Vitals:   02/20/17 1318 02/20/17 2157 02/21/17 0624 02/21/17 1337  BP: 132/60 134/63 134/63 130/63   Pulse: 78 75 74 83  Resp: 18 18 18 16   Temp: 98.9 F (37.2 C) 99 F (37.2 C) 98.3 F (36.8 C) 98.3 F (36.8 C)  TempSrc: Oral Oral Oral Oral  SpO2: 98% 97% 95% 99%  Weight:      Height:        General: Pt is alert, awake, not in acute distress Cardiovascular: RRR, S1/S2 +, no rubs, no gallops Respiratory: CTA bilaterally, no wheezing, no rhonchi Abdominal: Soft, NT, ND, bowel sounds + Extremities: no edema, no cyanosis    The results of significant diagnostics from this hospitalization (including imaging, microbiology, ancillary and laboratory) are listed below for reference.     Microbiology: Recent Results (from the past 240 hour(s))  MRSA PCR Screening     Status: None   Collection Time: 02/16/17  1:30 PM  Result Value Ref Range Status   MRSA by PCR NEGATIVE NEGATIVE Final    Comment:        The GeneXpert MRSA Assay (FDA approved for NASAL specimens only), is one component of a comprehensive MRSA colonization surveillance program. It is not intended to diagnose MRSA infection nor to guide or monitor treatment for MRSA infections.   Gastrointestinal Panel by PCR , Stool     Status: None   Collection Time: 02/20/17  2:18 PM  Result Value Ref Range Status   Campylobacter species NOT DETECTED NOT  DETECTED Final   Plesimonas shigelloides NOT DETECTED NOT DETECTED Final   Salmonella species NOT DETECTED NOT DETECTED Final   Yersinia enterocolitica NOT DETECTED NOT DETECTED Final   Vibrio species NOT DETECTED NOT DETECTED Final   Vibrio cholerae NOT DETECTED NOT DETECTED Final   Enteroaggregative E coli (EAEC) NOT DETECTED NOT DETECTED Final   Enteropathogenic E coli (EPEC) NOT DETECTED NOT DETECTED Final   Enterotoxigenic E coli (ETEC) NOT DETECTED NOT DETECTED Final   Shiga like toxin producing E coli (STEC) NOT DETECTED NOT DETECTED Final   Shigella/Enteroinvasive E coli (EIEC) NOT DETECTED NOT DETECTED Final   Cryptosporidium NOT DETECTED NOT DETECTED Final    Cyclospora cayetanensis NOT DETECTED NOT DETECTED Final   Entamoeba histolytica NOT DETECTED NOT DETECTED Final   Giardia lamblia NOT DETECTED NOT DETECTED Final   Adenovirus F40/41 NOT DETECTED NOT DETECTED Final   Astrovirus NOT DETECTED NOT DETECTED Final   Norovirus GI/GII NOT DETECTED NOT DETECTED Final   Rotavirus A NOT DETECTED NOT DETECTED Final   Sapovirus (I, II, IV, and V) NOT DETECTED NOT DETECTED Final    Comment: Performed at [facility], [address] [place], [place] [address]  C difficile quick scan w PCR reflex     Status: None   Collection Time: 02/21/17 11:04 AM  Result Value Ref Range Status   C Diff antigen NEGATIVE NEGATIVE Final   C Diff toxin NEGATIVE NEGATIVE Final   C Diff interpretation No C. difficile detected.  Final     Labs: BNP (last 3 results) No results for input(s): BNP in the last 8760 hours. Basic Metabolic Panel: Recent Labs  Lab 02/15/17 1955  02/16/17 2349 02/17/17 0333 02/18/17 0446 02/19/17 0458 02/21/17 0506  NA 135   < > 138 138  138 134* 137 139  K 4.4   < > 3.0* 3.0*  3.0* 3.0* 2.9* 3.4*  CL 106   < > 112* 113*  112* 103 102 103  CO2 10*   < > 19* 19*  20* 24 26 27   GLUCOSE 411*   < > 167* 193*  195* 254* 178* 149*  BUN 28*   < > 12 11  11  <5* 6 9  CREATININE 1.06*   < > 0.47 0.43*  0.46 0.41* 0.48 0.40*  CALCIUM 10.3   < > 8.4* 8.3*  8.4* 8.4* 8.6* 8.6*  MG 2.4  --   --  1.7 1.6* 1.9  --    < > = values in this interval not displayed.   Liver Function Tests: Recent Labs  Lab 02/15/17 1955 02/17/17 0333  AST 47* 60*  ALT 62* 69*  ALKPHOS 129* 86  BILITOT 2.1* 1.1  PROT 8.9* 5.7*  ALBUMIN 4.6 3.0*   Recent Labs  Lab 02/15/17 1955  LIPASE 36   No results for input(s): AMMONIA in the last 168 hours. CBC: Recent Labs  Lab 02/15/17 1955 02/17/17 0333 02/18/17 0446 02/19/17 0458 02/20/17 0517 02/21/17 0506  WBC 12.4* 6.9 6.2 6.5 7.4 6.9  NEUTROABS 10.1* 4.7 4.0 4.2  --   --   HGB 17.6*  12.4 13.1 13.3 13.3 13.4  HCT 50.1* 35.7* 37.1 37.8 38.3 39.3  MCV 85.3 84.4 83.9 84.6 85.3 86.0  PLT 386 273 268 256 263 277   Cardiac Enzymes: No results for input(s): CKTOTAL, CKMB, CKMBINDEX, TROPONINI in the last 168 hours. BNP: Invalid input(s): POCBNP CBG: Recent Labs  Lab 02/20/17 1142 02/20/17 1701 02/20/17 2245 02/21/17 [id number]  02/21/17 1148  GLUCAP 167* 124* 221* 153* 185*   D-Dimer Recent Labs    02/19/17 1145  DDIMER 1.48*   Hgb A1c No results for input(s): HGBA1C in the last 72 hours. Lipid Profile No results for input(s): CHOL, HDL, LDLCALC, TRIG, CHOLHDL, LDLDIRECT in the last 72 hours. Thyroid function studies No results for input(s): TSH, T4TOTAL, T3FREE, THYROIDAB in the last 72 hours.  Invalid input(s): FREET3 Anemia work up No results for input(s): VITAMINB12, FOLATE, FERRITIN, TIBC, IRON, RETICCTPCT in the last 72 hours. Urinalysis No results found for: COLORURINE, APPEARANCEUR, LABSPEC, PHURINE, GLUCOSEU, HGBUR, BILIRUBINUR, KETONESUR, PROTEINUR, UROBILINOGEN, NITRITE, LEUKOCYTESUR Sepsis Labs Invalid input(s): PROCALCITONIN,  WBC,  LACTICIDVEN Microbiology Recent Results (from the past 240 hour(s))  MRSA PCR Screening     Status: None   Collection Time: 02/16/17  1:30 PM  Result Value Ref Range Status   MRSA by PCR NEGATIVE NEGATIVE Final    Comment:        The GeneXpert MRSA Assay (FDA approved for NASAL specimens only), is one component of a comprehensive MRSA colonization surveillance program. It is not intended to diagnose MRSA infection nor to guide or monitor treatment for MRSA infections.   Gastrointestinal Panel by PCR , Stool     Status: None   Collection Time: 02/20/17  2:18 PM  Result Value Ref Range Status   Campylobacter species NOT DETECTED NOT DETECTED Final   Plesimonas shigelloides NOT DETECTED NOT DETECTED Final   Salmonella species NOT DETECTED NOT DETECTED Final   Yersinia enterocolitica NOT DETECTED NOT DETECTED  Final   Vibrio species NOT DETECTED NOT DETECTED Final   Vibrio cholerae NOT DETECTED NOT DETECTED Final   Enteroaggregative E coli (EAEC) NOT DETECTED NOT DETECTED Final   Enteropathogenic E coli (EPEC) NOT DETECTED NOT DETECTED Final   Enterotoxigenic E coli (ETEC) NOT DETECTED NOT DETECTED Final   Shiga like toxin producing E coli (STEC) NOT DETECTED NOT DETECTED Final   Shigella/Enteroinvasive E coli (EIEC) NOT DETECTED NOT DETECTED Final   Cryptosporidium NOT DETECTED NOT DETECTED Final   Cyclospora cayetanensis NOT DETECTED NOT DETECTED Final   Entamoeba histolytica NOT DETECTED NOT DETECTED Final   Giardia lamblia NOT DETECTED NOT DETECTED Final   Adenovirus F40/41 NOT DETECTED NOT DETECTED Final   Astrovirus NOT DETECTED NOT DETECTED Final   Norovirus GI/GII NOT DETECTED NOT DETECTED Final   Rotavirus A NOT DETECTED NOT DETECTED Final   Sapovirus (I, II, IV, and V) NOT DETECTED NOT DETECTED Final    Comment: Performed at [facility], [address] [place], [place] [address]  C difficile quick scan w PCR reflex     Status: None   Collection Time: 02/21/17 11:04 AM  Result Value Ref Range Status   C Diff antigen NEGATIVE NEGATIVE Final   C Diff toxin NEGATIVE NEGATIVE Final   C Diff interpretation No C. difficile detected.  Final     Time coordinating discharge: Over 30 minutes  SIGNED:   Alberteen Sam, MD  [facility] 02/21/2017, 2:11 PM

## 2017-02-21 NOTE — Progress Notes (Signed)
Patient ID: Nicole PorterSharon H Kirk, female   DOB: [birth year], 56 y.o.   MRN: [id number]     Subjective: No vomiting or abdominal pain in over 24 hours.  Tolerating her full liquid diet.  Continues to have loose stools and C. difficile testing in progress.  Objective: Vital signs in last 24 hours: Temp:  [98.3 F (36.8 C)-99 F (37.2 C)] 98.3 F (36.8 C) (12/30 0624) Pulse Rate:  [74-82] 74 (12/30 0624) Resp:  [18] 18 (12/30 0624) BP: (132-134)/(60-65) 134/63 (12/30 0624) SpO2:  [95 %-98 %] 95 % (12/30 0624) Last BM Date: 02/20/17  Intake/Output from previous day: 12/29 0701 - 12/30 0700 In: 230 [P.O.:230] Out: -  Intake/Output this shift: No intake/output data recorded.  General appearance: alert, cooperative and no distress GI: normal findings: soft, non-tender Incision/Wound: No erythema or drainage  Lab Results:  Recent Labs    02/20/17 0517 02/21/17 0506  WBC 7.4 6.9  HGB 13.3 13.4  HCT 38.3 39.3  PLT 263 277   BMET Recent Labs    02/19/17 0458 02/21/17 0506  NA 137 139  K 2.9* 3.4*  CL 102 103  CO2 26 27  GLUCOSE 178* 149*  BUN 6 9  CREATININE 0.48 0.40*  CALCIUM 8.6* 8.6*     Studies/Results: Ct Angio Chest Pe W Or Wo Contrast  Result Date: 02/19/2017 CLINICAL DATA:  Recent sleeve gastrectomy. Now with nausea and vomiting. Diabetes and hypertension. Rule out pulmonary embolism. EXAM: CT ANGIOGRAPHY CHEST WITH CONTRAST TECHNIQUE: Multidetector CT imaging of the chest was performed using the standard protocol during bolus administration of intravenous contrast. Multiplanar CT image reconstructions and MIPs were obtained to evaluate the vascular anatomy. CONTRAST:  100mL ISOVUE-370 IOPAMIDOL (ISOVUE-370) INJECTION 76% COMPARISON:  None. FINDINGS: Cardiovascular: Negative for pulmonary embolism. Pulmonary artery is normal in caliber. Minimal atherosclerotic disease aortic arch without aneurysm or dissection. Coronary artery calcification is mild.  Mediastinum/Nodes: Diffuse thickening of the esophagus, possible esophagitis. Small hiatal hernia No mass or adenopathy. Lungs/Pleura: Negative Upper Abdomen: Recent gastric surgery without surrounding fluid or hematoma Musculoskeletal: Mild degenerative changes thoracic Spine Review of the MIP images confirms the above findings. IMPRESSION: Negative for pulmonary embolism Esophageal thickening, possible esophagitis Electronically Signed   By: Marlan Palauharles  Clark M.D.   On: 02/19/2017 15:19    Anti-infectives: Anti-infectives (From admission, onward)   None      Assessment/Plan: 2 weeks status post laparoscopic sleeve gastrectomy in [place].  Presented with persistent nausea and vomiting and upper GI showing narrowing near the incisura.  Currently however she is now tolerating her fluid diet well.  From her postop sleeve point of view I think she is ready for discharge and would continue liquid diet only for at least 1 week.  If she is able to progress her diet without pain or vomiting then I do not think that any further follow-up imaging studies are indicated.  From my point of view she is ready for discharge when her C. difficile testing is complete.  We will sign off and be available as needed.    LOS: 6 days    Mariella SaaBenjamin T Shalen Petrak 02/21/2017

## 2019-02-23 IMAGING — CT CT ABD-PELV W/ CM
2 of 5 series · 16 of 46 positions shown, 18 images · IV contrast (ISOVUE)
Comparison: None.

CLINICAL DATA: Nausea and vomiting

EXAM:
CT ABDOMEN AND PELVIS WITH CONTRAST
TECHNIQUE: Multidetector CT imaging of the abdomen and pelvis was performed
using the standard protocol following bolus administration of
intravenous contrast.
CONTRAST:  100mL IAZEY7-E33 IOPAMIDOL (IAZEY7-E33) INJECTION 61%

[Series 2: axial st · axial · 0.80mm/px · z∈[-433,-63]mm · 13 of 88 slices shown, 15 images]
[im 7/88  soft-tissue]
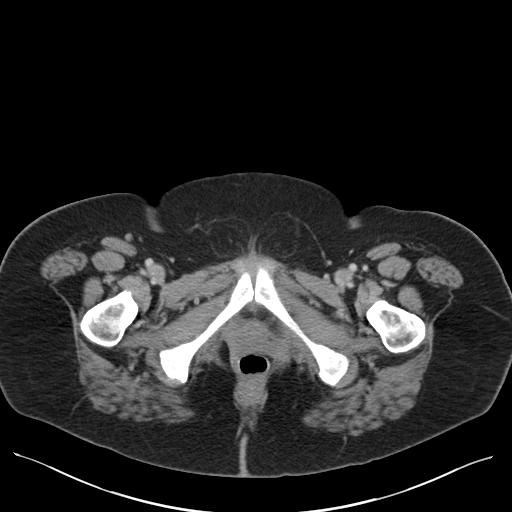
[im 7/88  bone]
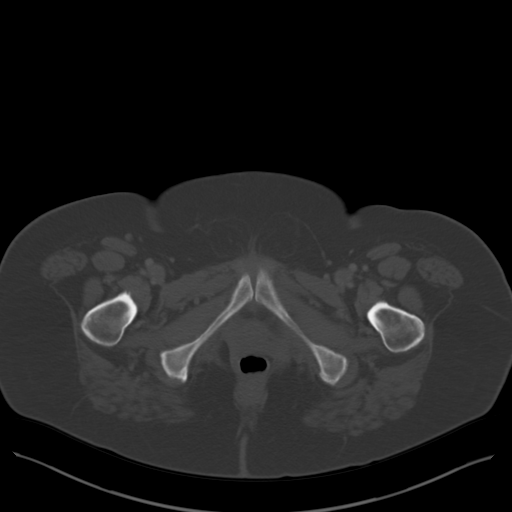
[im 13/88  soft-tissue]
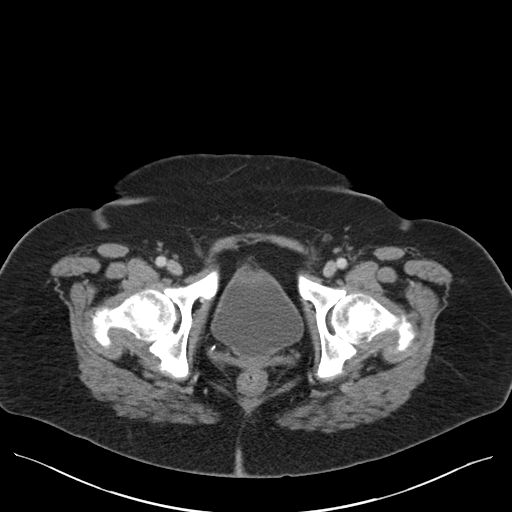
[im 19/88  soft-tissue]
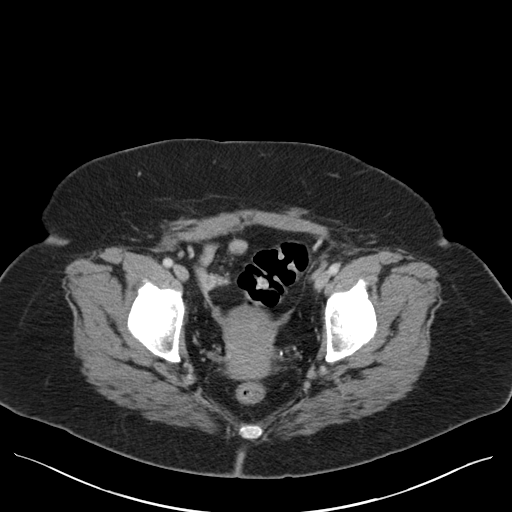
[im 25/88  soft-tissue]
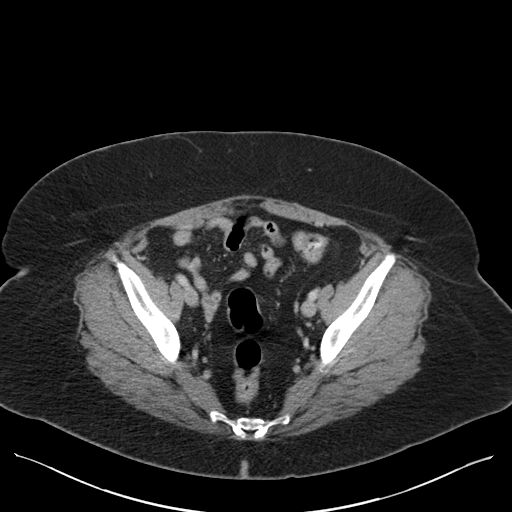
[im 32/88  soft-tissue]
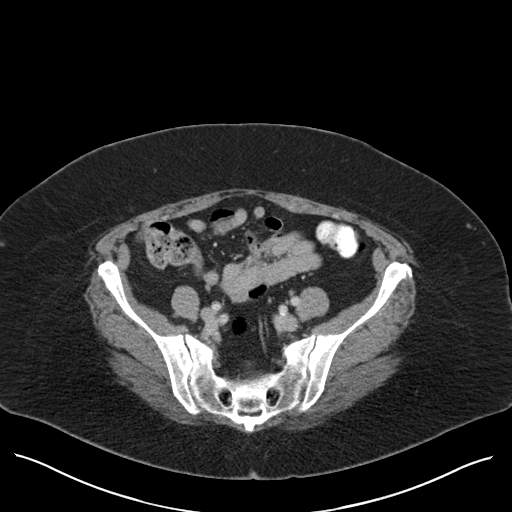
[im 38/88  soft-tissue]
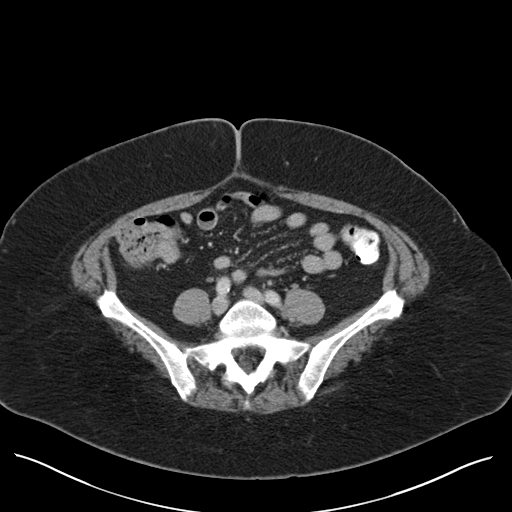
[im 44/88  soft-tissue]
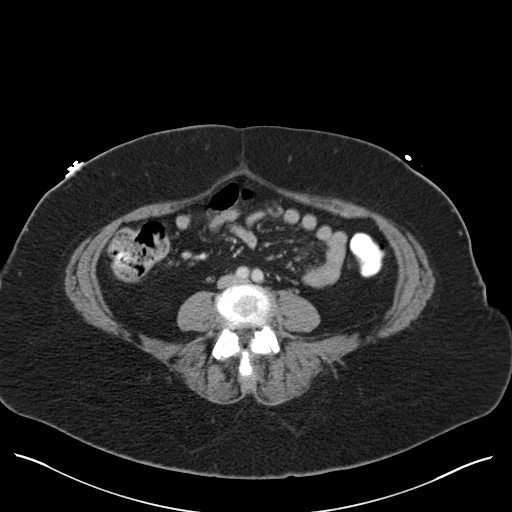
[im 50/88  soft-tissue]
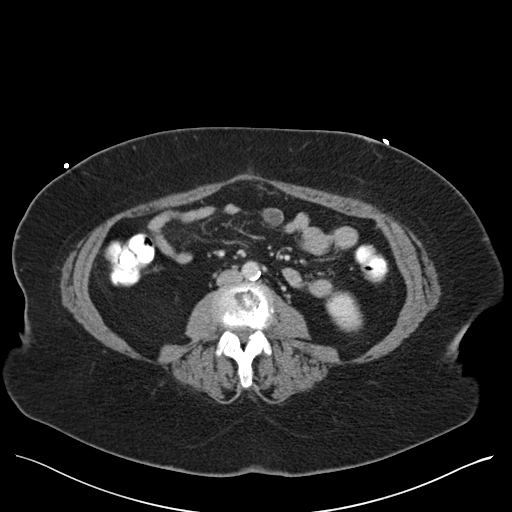
[im 56/88  soft-tissue]
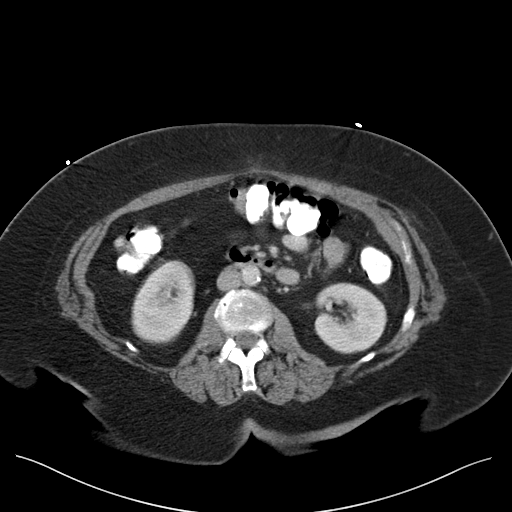
[im 56/88  bone]
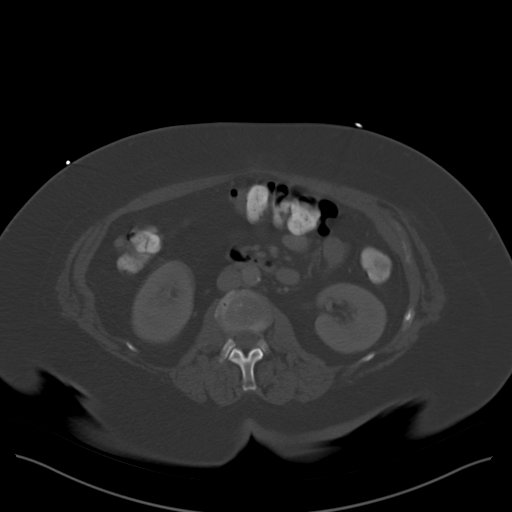
[im 63/88  soft-tissue]
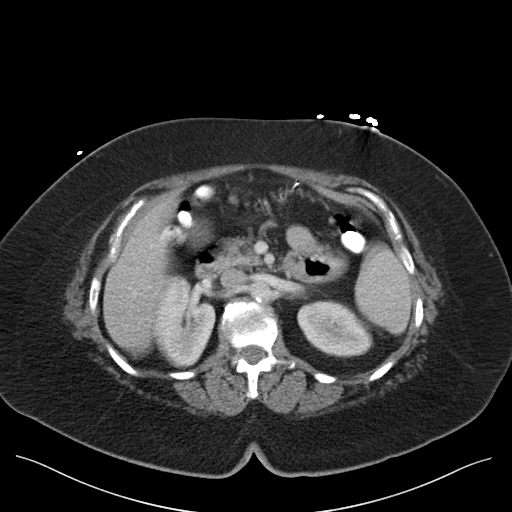
[im 69/88  soft-tissue]
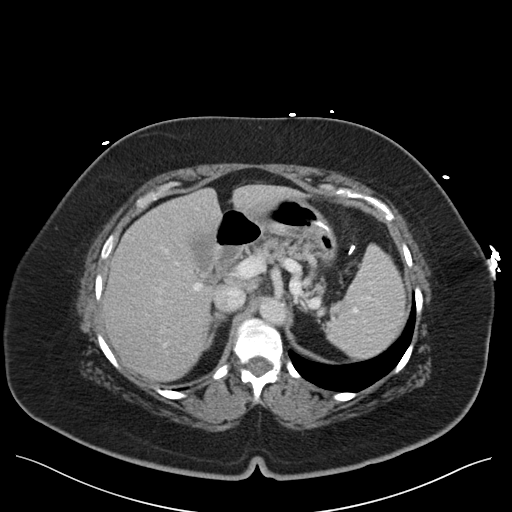
[im 75/88  soft-tissue]
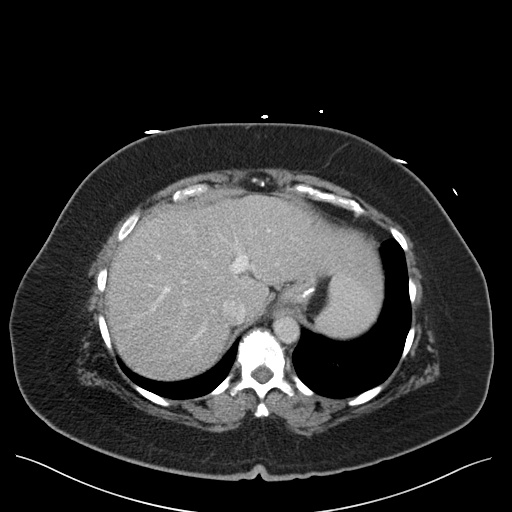
[im 81/88  soft-tissue]
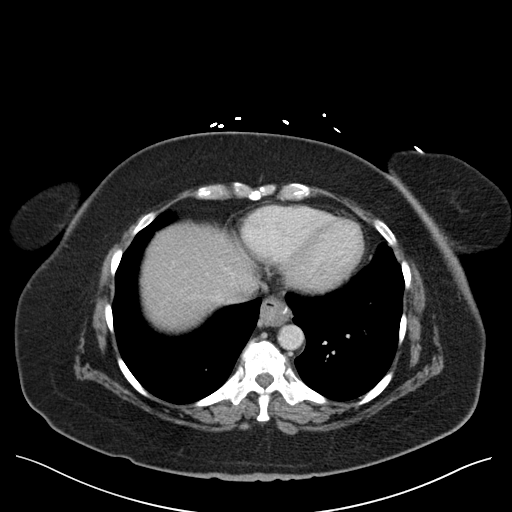

[Series 5: coronal st · coronal · 0.82mm/px · 3 of 93 slices shown]
[im 31/93  soft-tissue]
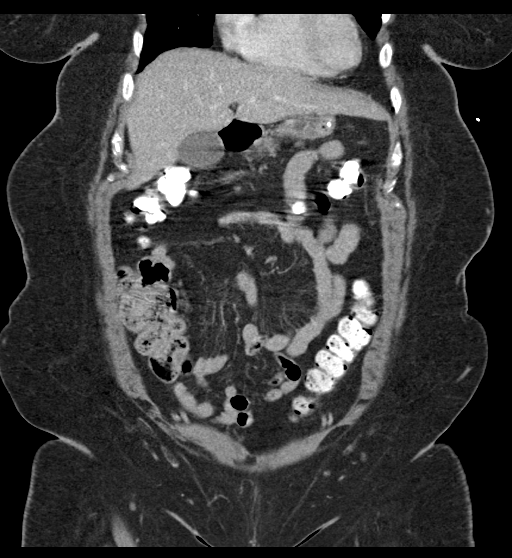
[im 41/93  soft-tissue]
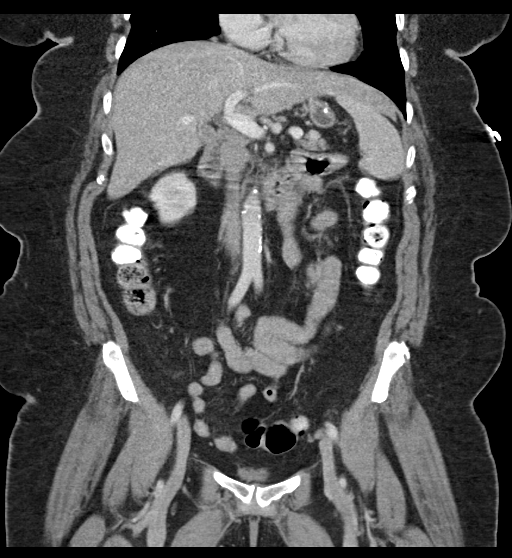
[im 52/93  soft-tissue]
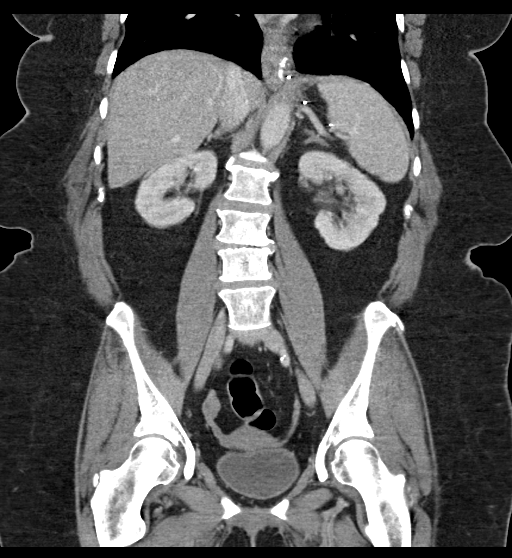

[16 of 46 positions shown; findings below may reference images not displayed]

FINDINGS: Lower chest: No pulmonary nodules or pleural effusion. No visible
pericardial effusion.

Hepatobiliary: Normal hepatic contours and density. No visible
biliary dilatation. Normal gallbladder.

Pancreas: Normal contours without ductal dilatation. No
peripancreatic fluid collection.

Spleen: Normal.

Adrenals/Urinary Tract:

--Adrenal glands: Normal.

--Right kidney/ureter: No hydronephrosis or perinephric stranding.
No nephrolithiasis. No obstructing ureteral stones.

--Left kidney/ureter: No hydronephrosis or perinephric stranding. No
nephrolithiasis. No obstructing ureteral stones.

--Urinary bladder: Unremarkable.

Stomach/Bowel:

--Stomach/Duodenum: Status post sleeve gastrectomy. Small hiatal
hernia. Normal duodenum.

--Small bowel: No dilatation or inflammation.

--Colon: No focal abnormality.

--Appendix: Normal.

Vascular/Lymphatic: Normal course and caliber of the major abdominal
vessels. No abdominal or pelvic lymphadenopathy.

Reproductive: Normal uterus and ovaries.

Musculoskeletal. No bony spinal canal stenosis or focal osseous
abnormality.

Other: None.
IMPRESSION: 1. No acute abdominopelvic abnormality.
2. Status post sleeve gastrectomy with small hiatal hernia.
3.  Aortic Atherosclerosis ([id number]).
